# Patient Record
Sex: Male | Born: 1975 | Race: White | Hispanic: No | Marital: Married | Smoking: Never smoker
Health system: Southern US, Community
[De-identification: ages and names within clinical notes are randomized; demographics above are authoritative.]

## PROBLEM LIST (undated history)

## (undated) DIAGNOSIS — M542 Cervicalgia: Secondary | ICD-10-CM

## (undated) DIAGNOSIS — R5600 Simple febrile convulsions: Secondary | ICD-10-CM

## (undated) DIAGNOSIS — IMO0002 Reserved for concepts with insufficient information to code with codable children: Secondary | ICD-10-CM

## (undated) DIAGNOSIS — G8929 Other chronic pain: Secondary | ICD-10-CM

## (undated) HISTORY — DX: Other chronic pain: G89.29

## (undated) HISTORY — DX: Simple febrile convulsions: R56.00

## (undated) HISTORY — DX: Reserved for concepts with insufficient information to code with codable children: IMO0002

## (undated) HISTORY — DX: Cervicalgia: M54.2

---

## 2005-10-26 ENCOUNTER — Ambulatory Visit: Payer: Self-pay | Admitting: Internal Medicine

## 2006-01-10 ENCOUNTER — Ambulatory Visit: Payer: Self-pay | Admitting: Family Medicine

## 2007-03-08 DIAGNOSIS — R569 Unspecified convulsions: Secondary | ICD-10-CM | POA: Insufficient documentation

## 2007-07-08 ENCOUNTER — Encounter: Payer: Self-pay | Admitting: Family Medicine

## 2007-08-28 ENCOUNTER — Telehealth: Payer: Self-pay | Admitting: *Deleted

## 2007-08-31 ENCOUNTER — Telehealth: Payer: Self-pay | Admitting: Family Medicine

## 2007-09-10 ENCOUNTER — Ambulatory Visit (HOSPITAL_COMMUNITY): Admission: RE | Admit: 2007-09-10 | Discharge: 2007-09-11 | Payer: Self-pay | Admitting: Orthopedic Surgery

## 2007-10-04 ENCOUNTER — Ambulatory Visit: Payer: Self-pay | Admitting: Family Medicine

## 2007-10-04 DIAGNOSIS — L509 Urticaria, unspecified: Secondary | ICD-10-CM

## 2010-05-14 ENCOUNTER — Ambulatory Visit: Payer: Self-pay | Admitting: Family Medicine

## 2010-05-14 DIAGNOSIS — H8110 Benign paroxysmal vertigo, unspecified ear: Secondary | ICD-10-CM

## 2010-05-25 ENCOUNTER — Ambulatory Visit: Payer: Self-pay | Admitting: Internal Medicine

## 2010-05-25 DIAGNOSIS — B9789 Other viral agents as the cause of diseases classified elsewhere: Secondary | ICD-10-CM

## 2010-05-26 ENCOUNTER — Telehealth: Payer: Self-pay | Admitting: Internal Medicine

## 2010-08-25 NOTE — Assessment & Plan Note (Signed)
Summary: BRONCHITIS ? // RS   Vital Signs:  Patient profile:   35 year old male Height:      71 inches Weight:      209 pounds BMI:     29.25 O2 Sat:      99 % on Room air Temp:     98.5 degrees F oral Pulse rate:   71 / minute BP sitting:   100 / 70  (left arm) Cuff size:   large  Vitals Entered By: Romualdo Bolk, CMA (AAMA) (May 25, 2010 3:17 PM)  O2 Flow:  Room air CC: Coughing, congestion, sore throat, weak, achey and has a headache. Pt states that this has been going on since 10/27 and worse the past few days.    History of Present Illness: Jesse Reese comes in today  for    above today acute problem.   he is generally well and was seen this past month for positional vertigo that has subsided  and now has had 4-5 days of  cough congestion and st   and the past 24 hours severe myalgias and malasie .. cough is dry to some mucous . Marland Kitchen Took nyquil last pm.  Works as Environmental manager and busy.  No asthma .    ?if had a fever .  some chills  last night .   usee  advil cold.  No hx of pneumonia  and no cp sob . or wheezing.   Has children at home . some RTIs .    Preventive Screening-Counseling & Management  Alcohol-Tobacco     Smoking Status: quit  Caffeine-Diet-Exercise     Does Patient Exercise: yes  Current Medications (verified): 1)  Meclizine Hcl 25 Mg Tabs (Meclizine Hcl) .Marland Kitchen.. 1 Q 4 Hours As Needed Dizziness  Allergies (verified): No Known Drug Allergies  Past History:  Past medical, surgical, family and social histories (including risk factors) reviewed for relevance to current acute and chronic problems.  Past Medical History: Reviewed history from 05/14/2010 and no changes required. 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 Denies surgical history  Past History:  Care Management: None Current  Family History: Reviewed history from  03/08/2007 and no changes required. Family History Hypertension Family History Psychiatric care  Social History: Reviewed history from 03/08/2007 and no changes required. Occupation:    Product/process development scientist   Former Smoker Alcohol use-yes Drug use-no Regular exercise-yes Married  Review of Systems  The patient denies anorexia, weight loss, vision loss, decreased hearing, hoarseness, chest pain, syncope, dyspnea on exertion, peripheral edema, abdominal pain, melena, hematochezia, and severe indigestion/heartburn.         myalgias   severe today   Physical Exam  General:  alert, well-developed, well-nourished, and well-hydrated.  looks washed out  Head:  normocephalic and atraumatic.   Eyes:  clear  without discharge  Ears:  R ear normal and L ear normal.   Nose:  no external deformity, no external erythema, and no nasal discharge.  congested  no face pain Mouth:  good dentition and pharynx pink and moist.   mild erythema Neck:  No deformities, masses, or tenderness noted. supple Lungs:  Normal respiratory effort, chest expands symmetrically. Lungs are clear to auscultation, no crackles or wheezes.no dullness.   Heart:  Normal rate and regular rhythm. S1 and S2 normal without gallop, murmur, click, rub or other extra sounds.no lifts.   Abdomen:  Bowel sounds positive,abdomen soft and non-tender without masses, organomegaly or  noted. Pulses:  nl cap refill  Skin:  turgor normal, color normal, no ecchymoses, and no petechiae.   Cervical Nodes:  No lymphadenopathy noted Psych:  Oriented X3, good eye contact, not anxious appearing, and not depressed appearing.     Impression & Recommendations:  Problem # 1:  VIRAL INFECTION, ACUTE (ICD-079.99)  acts flu like but days into illness    exam nl except for ur congestion .     no signs of pneumonia  reviewed alarm symptoms rest fluids  comfort medications  .   reevaluate if   needed  and as discussed .   His updated medication list for  this problem includes:    Hydrocodone-homatropine 5-1.5 Mg/74ml Syrp (Hydrocodone-homatropine) .Marland Kitchen... 1-2 tsp by mouth q4-6 hours as needed cough  Discussed symptomatic relief.   Complete Medication List: 1)  Meclizine Hcl 25 Mg Tabs (Meclizine hcl) .Marland Kitchen.. 1 q 4 hours as needed dizziness 2)  Hydrocodone-homatropine 5-1.5 Mg/30ml Syrp (Hydrocodone-homatropine) .Marland Kitchen.. 1-2 tsp by mouth q4-6 hours as needed cough  Patient Instructions: 1)  This acts like a viral infection  but  need to   be on alert for  early bacterial infection such as persistent fever  shortness or breath. 2)  rest tonight   3)  take temerperature to document and call tomorrow   if having fever  4)  fever is over 100- 100.3  5)  Increase fluids to avoid  dehydration 6)  Advil tylenol ok for body aches if needed 7)  Cough med if needed for comfort. Prescriptions: HYDROCODONE-HOMATROPINE 5-1.5 MG/5ML SYRP (HYDROCODONE-HOMATROPINE) 1-2 tsp by mouth q4-6 hours as needed cough  #6  oz x 1   Entered and Authorized by:   Madelin Headings MD   Signed by:   Madelin Headings MD on 05/25/2010   Method used:   Print then Give to Patient   RxID:   [id number]    Orders Added: 1)  Est. Patient Level III [62130]

## 2010-08-25 NOTE — Progress Notes (Signed)
Summary: Vomiting, ha and achey all over  Phone Note Call from Patient Call back at Home Phone [phone] Call back at [phone]   Caller: wife Summary of Call: Wife called saying that pt is now vomiting, sore all over and has a ha. Wife states that he has been vomiting twice. He has been able to eat something then falls asleep before he has a chance to vomit. He can't lift his head off the pillow. Keeps fluids down. He is having dizziness. Ha's feel like a hangover ha's. No fever. Initial call taken by: Romualdo Bolk, CMA (AAMA),  May 26, 2010 4:01 PM  Follow-up for Phone Call        Per [name]- Does his throat hurt more like strep? Can call in phenergan 25mg  #12 1 q 4-6 hours as needed nausea and vomiting. Increase hydration and ov tomorrow.  Pt's wife aware of this, rx called in and ov scheduled. Follow-up by: Romualdo Bolk, CMA (AAMA),  May 26, 2010 4:38 PM    New/Updated Medications: PROMETHAZINE HCL 25 MG TABS (PROMETHAZINE HCL) 1 by mouth q 4-6 hours as needed for nausea and vomiting Prescriptions: PROMETHAZINE HCL 25 MG TABS (PROMETHAZINE HCL) 1 by mouth q 4-6 hours as needed for nausea and vomiting  #12 x 0   Entered by:   Romualdo Bolk, CMA (AAMA)   Authorized by:   Madelin Headings MD   Signed by:   Romualdo Bolk, CMA (AAMA) on 05/26/2010   Method used:   Electronically to        [facility]. [address] [phone]* (retail)       [address]       [place], [place]  [address]       Ph: [phone] or [phone]       Fax: [phone]   RxID:   [id number]

## 2010-08-25 NOTE — Assessment & Plan Note (Signed)
Summary: dizzy spells/njr   Vital Signs:  Patient profile:   35 year old male Weight:      209 pounds O2 Sat:      98 % Temp:     97.7 degrees F Pulse rate:   61 / minute BP sitting:   108 / 86  (left arm) Cuff size:   large  Vitals Entered By: Pura Spice, RN (May 14, 2010 10:28 AM)  Contraindications/Deferment of Procedures/Staging:    Test/Procedure: FLU VAX    Reason for deferment: patient declined  CC: dizzy spells at nite time. last 2-3 min. denies losing conscousness. Hx diabetes in famkily    History of Present Illness: Here for 6 brief episodes of dizziness which he describes as the room spinning around him. 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 he had mild nausea with several of them but no vomitting. No HA or vision changes or any other neurologic deficilts. No recent head colds or allergy problems.   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 Denies surgical history  Review of Systems  The patient denies anorexia, fever, weight loss, weight gain, vision loss, decreased hearing, hoarseness, chest pain, syncope, dyspnea on exertion, peripheral edema, prolonged cough, headaches, hemoptysis, abdominal pain, melena, hematochezia, severe indigestion/heartburn, hematuria, incontinence, genital sores, muscle weakness, suspicious skin lesions, transient blindness, difficulty walking, depression, unusual weight change, abnormal bleeding, enlarged lymph nodes, angioedema, breast masses, and testicular masses.    Physical Exam  General:  Well-developed,well-nourished,in no acute distress; alert,appropriate and cooperative throughout examination Head:  Normocephalic and atraumatic without obvious abnormalities. No apparent alopecia or  balding. Eyes:  No corneal or conjunctival inflammation noted. EOMI. Perrla. Funduscopic exam benign, without hemorrhages, exudates or papilledema. Vision grossly normal. Ears:  External ear exam shows no significant lesions or deformities.  Otoscopic examination reveals clear canals, tympanic membranes are intact bilaterally without bulging, retraction, inflammation or discharge. Hearing is grossly normal bilaterally. Nose:  External nasal examination shows no deformity or inflammation. Nasal mucosa are pink and moist without lesions or exudates. Mouth:  Oral mucosa and oropharynx without lesions or exudates.  Teeth in good repair. Neck:  No deformities, masses, or tenderness noted. Lungs:  Normal respiratory effort, chest expands symmetrically. Lungs are clear to auscultation, no crackles or wheezes. Heart:  Normal rate and regular rhythm. S1 and S2 normal without gallop, murmur, click, rub or other extra sounds. Neurologic:  No cranial nerve deficits noted. Station and gait are normal. Plantar reflexes are down-going bilaterally. DTRs are symmetrical throughout. Sensory, motor and coordinative functions appear intact.   Impression & Recommendations:  Problem # 1:  BENIGN POSITIONAL VERTIGO (ICD-386.11)  His updated medication list for this problem includes:    Meclizine Hcl 25 Mg Tabs (Meclizine hcl) .Marland Kitchen... 1 q 4 hours as needed dizziness  Complete Medication List: 1)  Meclizine Hcl 25 Mg Tabs (Meclizine hcl) .Marland Kitchen.. 1 q 4 hours as needed dizziness  Patient Instructions: 1)  Try taking daily Claritin D for a month or so. Drink plenty of water.  2)  Please schedule a follow-up appointment as needed .  Prescriptions: MECLIZINE HCL 25 MG TABS (MECLIZINE HCL) 1 q 4 hours as needed dizziness  #60 x 5   Entered and Authorized by:   Nelwyn Salisbury MD  Signed by:   Nelwyn Salisbury MD on 05/14/2010   Method used:   Electronically to        [facility]. [address]. [phone]* (retail)       [address]       [place], [place]  [address]       Ph: [phone] or [phone]       Fax: [phone]   RxID:   [id number]    Orders Added: 1)  Est. Patient Level IV [25427]

## 2010-12-31 ENCOUNTER — Other Ambulatory Visit: Payer: Self-pay | Admitting: Family Medicine

## 2010-12-31 DIAGNOSIS — M542 Cervicalgia: Secondary | ICD-10-CM

## 2011-01-22 ENCOUNTER — Other Ambulatory Visit: Payer: Self-pay

## 2011-02-11 MED ORDER — DIAZEPAM 2 MG PO TABS
10.0000 mg | ORAL_TABLET | Freq: Once | ORAL | Status: AC
  Administered 2011-02-12: 10 mg via ORAL

## 2011-02-12 ENCOUNTER — Ambulatory Visit
Admission: RE | Admit: 2011-02-12 | Discharge: 2011-02-12 | Disposition: A | Discharge status: Home / Self Care | Payer: 59 | Source: Ambulatory Visit | Attending: Family Medicine | Admitting: Family Medicine

## 2011-02-12 DIAGNOSIS — M542 Cervicalgia: Secondary | ICD-10-CM

## 2011-02-12 MED ORDER — MEPERIDINE HCL 100 MG/ML IJ SOLN
75.0000 mg | Freq: Once | INTRAMUSCULAR | Status: AC
  Administered 2011-02-12: 75 mg via INTRAMUSCULAR

## 2011-02-12 MED ORDER — IOHEXOL 300 MG/ML  SOLN
10.0000 mL | Freq: Once | INTRAMUSCULAR | Status: AC | PRN
  Administered 2011-02-12: 10 mL via INTRATHECAL

## 2011-02-12 MED ORDER — ONDANSETRON HCL 4 MG/2ML IJ SOLN
4.0000 mg | Freq: Once | INTRAMUSCULAR | Status: AC
  Administered 2011-02-12: 4 mg via INTRAMUSCULAR

## 2011-02-24 ENCOUNTER — Telehealth: Payer: Self-pay | Admitting: *Deleted

## 2011-02-24 MED ORDER — PREDNISONE (PAK) 10 MG PO TABS: 10.0000 mg | ORAL_TABLET | Freq: Every day | ORAL | Status: AC

## 2011-02-24 NOTE — Telephone Encounter (Signed)
Call in a 12 day taper pack of 10 mg prednisone 

## 2011-02-24 NOTE — Telephone Encounter (Signed)
Script called in and pt aware. 

## 2011-02-24 NOTE — Telephone Encounter (Signed)
Requesting rx for prednisone, pt has poison ivy.  Please send to [facility] ([address]

## 2011-04-07 ENCOUNTER — Encounter: Payer: Self-pay | Admitting: Family Medicine

## 2011-04-07 ENCOUNTER — Ambulatory Visit (INDEPENDENT_AMBULATORY_CARE_PROVIDER_SITE_OTHER): Payer: 59 | Admitting: Family Medicine

## 2011-04-07 VITALS — BP 120/80 | HR 73 | Temp 97.7°F | Wt 210.0 lb

## 2011-04-07 DIAGNOSIS — J329 Chronic sinusitis, unspecified: Secondary | ICD-10-CM

## 2011-04-07 MED ORDER — AZITHROMYCIN 250 MG PO TABS: ORAL_TABLET | ORAL | Status: AC

## 2011-04-07 MED ORDER — HYDROCODONE-HOMATROPINE 5-1.5 MG/5ML PO SYRP: 5.0000 mL | ORAL_SOLUTION | Freq: Four times a day (QID) | ORAL | Status: DC | PRN

## 2011-04-07 NOTE — Progress Notes (Signed)
  Subjective:    Patient ID: Jesse Reese, male    DOB: 1976-06-27, 35 y.o.   MRN: [id number]  HPI Here for 5 days of sinus pressure, PND, ST, and a dry cough.    Review of Systems  Constitutional: Negative.   HENT: Positive for congestion, sore throat, postnasal drip and sinus pressure.   Eyes: Negative.   Respiratory: Positive for cough.        Objective:   Physical Exam  Constitutional: He appears well-developed and well-nourished.  HENT:  Right Ear: External ear normal.  Left Ear: External ear normal.  Nose: Nose normal.  Mouth/Throat: Oropharynx is clear and moist. No oropharyngeal exudate.  Eyes: Conjunctivae are normal. Pupils are equal, round, and reactive to light.  Neck: Neck supple. No thyromegaly present.  Pulmonary/Chest: Effort normal and breath sounds normal.  Lymphadenopathy:    He has no cervical adenopathy.          Assessment & Plan:  Rest, fluids

## 2011-04-20 ENCOUNTER — Other Ambulatory Visit: Payer: Self-pay | Admitting: Family Medicine

## 2011-04-20 NOTE — Telephone Encounter (Signed)
Call in another 240 ml bottle  

## 2011-04-20 NOTE — Telephone Encounter (Signed)
Pt requesting refill on HYDROcodone-homatropine (HYCODAN) 5-1.5 MG/5ML syrup   [facility] [address]

## 2011-04-21 MED ORDER — HYDROCODONE-HOMATROPINE 5-1.5 MG/5ML PO SYRP: 5.0000 mL | ORAL_SOLUTION | Freq: Four times a day (QID) | ORAL | Status: DC | PRN

## 2011-04-21 NOTE — Telephone Encounter (Signed)
Pt called to check on status of Hydrocodone-Homatropine (Hycodan) Syrup. Pt has to pick up script today. Pt is leaving to go out of town this afternoon and will not be back until Tues. [facility] on [address] and [address].

## 2011-04-21 NOTE — Telephone Encounter (Signed)
I called in script and left voice message for pt. 

## 2012-05-15 ENCOUNTER — Ambulatory Visit: Payer: 59 | Admitting: Family Medicine

## 2012-05-17 ENCOUNTER — Ambulatory Visit (INDEPENDENT_AMBULATORY_CARE_PROVIDER_SITE_OTHER): Payer: 59 | Admitting: Family Medicine

## 2012-05-17 ENCOUNTER — Encounter: Payer: Self-pay | Admitting: Family Medicine

## 2012-05-17 VITALS — BP 120/80 | HR 90 | Temp 98.2°F | Wt 222.0 lb

## 2012-05-17 DIAGNOSIS — F411 Generalized anxiety disorder: Secondary | ICD-10-CM

## 2012-05-17 DIAGNOSIS — F419 Anxiety disorder, unspecified: Secondary | ICD-10-CM

## 2012-05-17 MED ORDER — FLUOXETINE HCL 20 MG PO TABS: 20.0000 mg | ORAL_TABLET | Freq: Every day | ORAL | Status: DC

## 2012-05-17 MED ORDER — LORAZEPAM 1 MG PO TABS: 1.0000 mg | ORAL_TABLET | Freq: Three times a day (TID) | ORAL | Status: DC | PRN

## 2012-05-17 NOTE — Progress Notes (Signed)
  Subjective:    Patient ID: Jesse Reese, male    DOB: 1976/02/07, 36 y.o.   MRN: [id number]  HPI Here to discuss possible "panic attacks". He has had anxiety episodes for years but over the past year these have become more frequent and more intense. He is under constant stress, primarily from his job. He works long hours at his job and then works more on his computer at home every day. He can't relax, he has trouble sleeping. At times he feels his chest get tight and he feels SOB. No chest pains. These are not related to exertion.    Review of Systems  Constitutional: Negative.   Respiratory: Positive for chest tightness and shortness of breath. Negative for cough and wheezing.   Cardiovascular: Negative.   Neurological: Negative.   Psychiatric/Behavioral: Positive for disturbed wake/sleep cycle and decreased concentration. Negative for hallucinations, confusion, dysphoric mood and agitation. The patient is nervous/anxious.        Objective:   Physical Exam  Constitutional: He is oriented to person, place, and time. He appears well-developed and well-nourished. No distress.  Neck: No thyromegaly present.  Cardiovascular: Normal rate, regular rhythm, normal heart sounds and intact distal pulses.   Pulmonary/Chest: Effort normal and breath sounds normal.  Lymphadenopathy:    He has no cervical adenopathy.  Neurological: He is alert and oriented to person, place, and time.  Psychiatric: His behavior is normal. Thought content normal.       Anxious           Assessment & Plan:  These are all symptoms of anxiety. Start on Prozac daily and supplement with prn Lorazepam. Recheck in 2-3 weeks

## 2012-05-31 ENCOUNTER — Telehealth: Payer: Self-pay | Admitting: Family Medicine

## 2012-05-31 MED ORDER — MALATHION 0.5 % EX LOTN: TOPICAL_LOTION | Freq: Once | CUTANEOUS | Status: DC

## 2012-05-31 NOTE — Telephone Encounter (Signed)
He and his wife just got back from a trip to [place], and they stayed in a condo while there. She is being treated for scabies with Ovide lotion, and he also has itchy red spots on the arms and torso. We will treat him the same way. I gave the rx to his wife here in the clinic.

## 2012-07-14 ENCOUNTER — Other Ambulatory Visit: Payer: Self-pay | Admitting: Family Medicine

## 2012-07-20 NOTE — Telephone Encounter (Signed)
Call in #[phone] with 5 rf 

## 2012-08-14 ENCOUNTER — Encounter: Payer: Self-pay | Admitting: Family Medicine

## 2012-08-14 ENCOUNTER — Telehealth: Payer: Self-pay | Admitting: Family Medicine

## 2012-08-14 ENCOUNTER — Ambulatory Visit (INDEPENDENT_AMBULATORY_CARE_PROVIDER_SITE_OTHER): Payer: 59 | Admitting: Family Medicine

## 2012-08-14 VITALS — HR 104 | Temp 99.3°F | Wt 224.0 lb

## 2012-08-14 DIAGNOSIS — J111 Influenza due to unidentified influenza virus with other respiratory manifestations: Secondary | ICD-10-CM

## 2012-08-14 MED ORDER — HYDROCODONE-HOMATROPINE 5-1.5 MG/5ML PO SYRP: 5.0000 mL | ORAL_SOLUTION | Freq: Three times a day (TID) | ORAL | Status: DC | PRN

## 2012-08-14 NOTE — Patient Instructions (Addendum)
Influenza or Influenza-like illness Facts Flu (influenza) is a contagious respiratory illness caused by the influenza viruses. It can cause mild to severe illness. While most healthy people recover from the flu without specific treatment and without complications, older people, young children, and people with certain health conditions are at higher risk for serious complications from the flu, including death. CAUSES   The flu virus is spread from person to person by respiratory droplets from coughing and sneezing.  A person can also become infected by touching an object or surface with a virus on it and then touching their mouth, eye or nose.  Adults may be able to infect others from 1 day before symptoms occur and up to 7 days after getting sick. So it is possible to give someone the flu even before you know you are sick and continue to infect others while you are sick. SYMPTOMS   Fever (usually high).  Headache.  Tiredness (can be extreme).  Cough.  Sore throat.  Runny or stuffy nose.  Body aches.  Diarrhea and vomiting may also occur, particularly in children.  These symptoms are referred to as "flu-like symptoms". A lot of different illnesses, including the common cold, can have similar symptoms. DIAGNOSIS   There are tests that can determine if you have the flu as long you are tested within the first 2 or 3 days of illness.  A doctor's exam and additional tests may be needed to identify if you have a disease that is a complicating the flu. RISKS AND COMPLICATIONS  Some of the complications caused by the flu include:  Bacterial pneumonia or progressive pneumonia caused by the flu virus.  Loss of body fluids (dehydration).  Worsening of chronic medical conditions, such as heart failure, asthma, or diabetes.  Sinus problems and ear infections. HOME CARE INSTRUCTIONS   Seek medical care early on.  If you are at high risk from complications of the flu, consult your  health-care provider as soon as you develop flu-like symptoms. Those at high risk for complications include:  People 65 years or older.  People with chronic medical conditions, including diabetes.  Pregnant women.  Young children.  Your caregiver may recommend use of an antiviral medication to help treat the flu.  If you get the flu, get plenty of rest, drink a lot of liquids, and avoid using alcohol and tobacco.  You can take over-the-counter medications to relieve the symptoms of the flu if your caregiver approves. (Never give aspirin to children or teenagers who have flu-like symptoms, particularly fever). PREVENTION  The single best way to prevent the flu is to get a flu vaccine each fall. Other measures that can help protect against the flu are:  Antiviral Medications  A number of antiviral drugs are approved for use in preventing the flu. These are prescription medications, and a doctor should be consulted before they are used.  Habits for Good Health  Cover your nose and mouth with a tissue when you cough or sneeze, throw the tissue away after you use it.  Wash your hands often with soap and water, especially after you cough or sneeze. If you are not near water, use an alcohol-based hand cleaner.  Avoid people who are sick.  If you get the flu, stay home from work or school. Avoid contact with other people so that you do not make them sick, too.  Try not to touch your eyes, nose, or mouth as germs ore often spread this way. IN CHILDREN,  EMERGENCY WARNING SIGNS THAT NEED URGENT MEDICAL ATTENTION:  Fast breathing or trouble breathing.  Bluish skin color.  Not drinking enough fluids.  Not waking up or not interacting.  Being so irritable that the child does not want to be held.  Flu-like symptoms improve but then return with fever and worse cough.  Fever with a rash. IN ADULTS, EMERGENCY WARNING SIGNS THAT NEED URGENT MEDICAL ATTENTION:  Difficulty breathing or  shortness of breath.  Pain or pressure in the chest or abdomen.  Sudden dizziness.  Confusion.  Severe or persistent vomiting. SEEK IMMEDIATE MEDICAL CARE IF:  You or someone you know is experiencing any of the symptoms above. When you arrive at the emergency center,report that you think you have the flu. You may be asked to wear a mask and/or sit in a secluded area to protect others from getting sick. MAKE SURE YOU:   Understand these instructions.  Monitor your condition.  Seek medical care if you are getting worse, or not improving. Document Released: 07/15/2003 Document Revised: 10/04/2011 Document Reviewed: 04/10/2009 [facility] Patient Information 2013 [place], [place].

## 2012-08-14 NOTE — Telephone Encounter (Signed)
Clent [facility] schedule full scheduled with [facility]

## 2012-08-14 NOTE — Progress Notes (Signed)
Chief Complaint  Patient presents with  . Cough    fever, body aches, chills, fatigue, nausea, headache     HPI:  -started: yesterday -symptoms:nasal congestion, sore throat, cough, fever up to 101.6, body aches -denies:fever, SOB, NVD, tooth pain -has tried: hydromet -sick contacts: daughter was sick last week with similar symptoms, also many people at work with similar symptoms -Hx of: no hx of chronic lung disease or chronic disease -did not have flu shot  ROS: See pertinent positives and negatives per HPI.  Past Medical History  Diagnosis Date  . Febrile seizure   . Chickenpox   . 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 Chronic neck pain     sees Dr. Nickola Major     Family History  Problem Relation Age of Onset  . Hypertension Other   . Mental illness Other     History   Social History  . Marital Status: Married    Spouse Name: N/A    Number of Children: N/A  . Years of Education: N/A   Social History Main Topics  . Smoking status: Never Smoker   . Smokeless tobacco: Never Used  . Alcohol Use: No  . Drug Use: No  . Sexually Active: None   Other Topics Concern  . None   Social History Narrative  . None    Current outpatient prescriptions:FLUoxetine (PROZAC) 20 MG tablet, Take 1 tablet (20 mg total) by mouth daily., Disp: 30 tablet, Rfl: 2;  LORazepam (ATIVAN) 1 MG tablet, TAKE 1 TABLET BY MOUTH EVERY 8 HOURS AS NEEDED FOR ANXIETY, Disp: 60 tablet, Rfl: 5 malathion (OVIDE) 0.5 % lotion, Apply topically once. Sprinkle lotion on dry hair and rub gently until the scalp is thoroughly moistened. Allow to dry naturally and leave uncovered., Disp: 59 mL, Rfl: 2;  tapentadol (NUCYNTA) 50 MG TABS, Take 50 mg by mouth 2 (two) times daily as needed., Disp: , Rfl: ;  HYDROcodone-homatropine (HYCODAN) 5-1.5 MG/5ML syrup, Take 5 mLs by mouth every 8 (eight) hours as needed for cough., Disp: 120 mL, Rfl: 0  EXAM:  Filed Vitals:   08/14/12 1349  Pulse: 104  Temp: 99.3 F  (37.4 C)    There is no height on file to calculate BMI.  GENERAL: vitals reviewed and listed above, alert, oriented, appears well hydrated and in no acute distress  HEENT: atraumatic, conjunttiva clear, no obvious abnormalities on inspection of external nose and ears, normal appearance of ear canals and TMs, clear nasal congestion, mild post oropharyngeal erythema with PND, no tonsillar edema or exudate, no sinus TTP  NECK: no obvious masses on inspection  LUNGS: clear to auscultation bilaterally, no wheezes, rales or rhonchi, good air movement  CV: HRRR, no peripheral edema  MS: moves all extremities without noticeable abnormality  PSYCH: pleasant and cooperative, no obvious depression or anxiety  ASSESSMENT AND PLAN:  Discussed the following assessment and plan:  1. Influenza-like illness  HYDROcodone-homatropine (HYCODAN) 5-1.5 MG/5ML syrup   -not high risks, not severe illness - advised supportive care and return precuations, cough med provided (risks discussed) -Patient advised to return or notify a doctor immediately if symptoms worsen or persist or new concerns arise.  Patient Instructions  Influenza or Influenza-like illness Facts Flu (influenza) is a contagious respiratory illness caused by the influenza viruses. It can cause mild to severe illness. While most healthy people recover from the flu without specific treatment and without complications, older people, young children, and people with certain  health conditions are at higher risk for serious complications from the flu, including death. CAUSES   The flu virus is spread from person to person by respiratory droplets from coughing and sneezing.  A person can also become infected by touching an object or surface with a virus on it and then touching their mouth, eye or nose.  Adults may be able to infect others from 1 day before symptoms occur and up to 7 days after getting sick. So it is possible to give someone the  flu even before you know you are sick and continue to infect others while you are sick. SYMPTOMS   Fever (usually high).  Headache.  Tiredness (can be extreme).  Cough.  Sore throat.  Runny or stuffy nose.  Body aches.  Diarrhea and vomiting may also occur, particularly in children.  These symptoms are referred to as "flu-like symptoms". A lot of different illnesses, including the common cold, can have similar symptoms. DIAGNOSIS   There are tests that can determine if you have the flu as long you are tested within the first 2 or 3 days of illness.  A doctor's exam and additional tests may be needed to identify if you have a disease that is a complicating the flu. RISKS AND COMPLICATIONS  Some of the complications caused by the flu include:  Bacterial pneumonia or progressive pneumonia caused by the flu virus.  Loss of body fluids (dehydration).  Worsening of chronic medical conditions, such as heart failure, asthma, or diabetes.  Sinus problems and ear infections. HOME CARE INSTRUCTIONS   Seek medical care early on.  If you are at high risk from complications of the flu, consult your health-care provider as soon as you develop flu-like symptoms. Those at high risk for complications include:  People 65 years or older.  People with chronic medical conditions, including diabetes.  Pregnant women.  Young children.  Your caregiver may recommend use of an antiviral medication to help treat the flu.  If you get the flu, get plenty of rest, drink a lot of liquids, and avoid using alcohol and tobacco.  You can take over-the-counter medications to relieve the symptoms of the flu if your caregiver approves. (Never give aspirin to children or teenagers who have flu-like symptoms, particularly fever). PREVENTION  The single best way to prevent the flu is to get a flu vaccine each fall. Other measures that can help protect against the flu are:  Antiviral Medications  A  number of antiviral drugs are approved for use in preventing the flu. These are prescription medications, and a doctor should be consulted before they are used.  Habits for Good Health  Cover your nose and mouth with a tissue when you cough or sneeze, throw the tissue away after you use it.  Wash your hands often with soap and water, especially after you cough or sneeze. If you are not near water, use an alcohol-based hand cleaner.  Avoid people who are sick.  If you get the flu, stay home from work or school. Avoid contact with other people so that you do not make them sick, too.  Try not to touch your eyes, nose, or mouth as germs ore often spread this way. IN CHILDREN, EMERGENCY WARNING SIGNS THAT NEED URGENT MEDICAL ATTENTION:  Fast breathing or trouble breathing.  Bluish skin color.  Not drinking enough fluids.  Not waking up or not interacting.  Being so irritable that the child does not want to be held.  Flu-like  symptoms improve but then return with fever and worse cough.  Fever with a rash. IN ADULTS, EMERGENCY WARNING SIGNS THAT NEED URGENT MEDICAL ATTENTION:  Difficulty breathing or shortness of breath.  Pain or pressure in the chest or abdomen.  Sudden dizziness.  Confusion.  Severe or persistent vomiting. SEEK IMMEDIATE MEDICAL CARE IF:  You or someone you know is experiencing any of the symptoms above. When you arrive at the emergency center,report that you think you have the flu. You may be asked to wear a mask and/or sit in a secluded area to protect others from getting sick. MAKE SURE YOU:   Understand these instructions.  Monitor your condition.  Seek medical care if you are getting worse, or not improving. Document Released: 07/15/2003 Document Revised: 10/04/2011 Document Reviewed: 04/10/2009 [facility] Patient Information 2013 [place], [place], [place]

## 2012-08-14 NOTE — Telephone Encounter (Signed)
Wife called. She and the kids were in to see [name] last week - all dx with influenza & all given TAMIFLU and HYDROMET cough syrup. Now her husband, who is [name] patient, has all the same symptoms. He cannot even get out of bed. She wants to know if [name] can just call him in the [place]-flu & cough syrup. Please advise and call wife back on cell phone - her phone is acting funny, please LMOM if no answer. Pt uses [facility] on [address].

## 2012-08-14 NOTE — Telephone Encounter (Signed)
Pt must be seen,per [name]. Can you call and offer a visit?

## 2012-08-15 ENCOUNTER — Telehealth: Payer: Self-pay

## 2012-08-15 MED ORDER — ONDANSETRON HCL 4 MG PO TABS: ORAL_TABLET | ORAL | Status: DC

## 2012-08-15 NOTE — Telephone Encounter (Signed)
Ok per Dr. Selena Batten to call in Zofran 4 mg take 1 every 6 to 8 hours and pt can try to take ibuprofen for headache instead.  Per Dr. Selena Batten this will probably get better when pt stops vomiting.  Rx called in to pharmacy.    Called and spoke with pt.  Advised of Dr. Elmyra Ricks recommendations.  Advised pt to keep plenty of fluids down.  Advised pt that an appt was needed if he got worse.

## 2012-08-15 NOTE — Telephone Encounter (Signed)
Pt's wife called and states she called earlier.  Pt's nausea and vomiting hit him hard during the night.  Pt needs something for nausea and pt wife states pt has been taking 3 Tylenol  and it is not touching his headache and pt threw the medication up.   Pls advise on what pt needs.  [facility] [address] is the pharmacy.

## 2012-08-17 ENCOUNTER — Telehealth: Payer: Self-pay | Admitting: Family Medicine

## 2012-08-17 NOTE — Telephone Encounter (Signed)
If already got hycodan prescribed at last visit - can not also get this. If pharmacy asking if can substitute then that is ok. But I gave Rx for cough medication at appointment

## 2012-08-21 ENCOUNTER — Telehealth: Payer: Self-pay | Admitting: Family Medicine

## 2012-08-21 NOTE — Telephone Encounter (Signed)
Per Dr. Selena Batten called pt to advise that pt was to only take the cough syrup at night and it is too early to refill. Called and spoke with pt and per Dr.Kim pt needs to be reevaluated if pt is having trouble breathing or having SOB.  If pt does not have a fever pt can return to work.  Per Dr. Selena Batten pt should not have run out of cough medication that quickly.  Advised pt that cough could last for a few weeks and halls with menthol and vicks vapor rub on the chest are good to use along with a humidifier.  Pt states he understands and is aware that rx for hycodan will not be called in again.

## 2012-08-21 NOTE — Telephone Encounter (Signed)
Patient's spouse called stating that he need a refill of his HYDROcodone-homatropine (HYCODAN) 5-1.5 MG/5ML syrup Take 5 mLs by mouth every 8 (eight) hours as needed for cough sent to [facility]. Please assist.

## 2012-08-22 ENCOUNTER — Ambulatory Visit: Payer: 59 | Admitting: Family Medicine

## 2012-08-22 DIAGNOSIS — Z0289 Encounter for other administrative examinations: Secondary | ICD-10-CM

## 2012-09-27 IMAGING — RF DG MYELOGRAM CERVICAL
10 series · 10 of 10 positions shown · IV contrast (omnipaque)
Comparison: MRI 09/10/2007.

MYELOGRAM  INJECTION
TECHNIQUE: Informed consent was obtained from the patient prior to
the procedure, including potential complications of headache,
allergy, infection and pain. Specific instructions were given
regarding 24 hour bedrest post procedure to prevent post-LP
headache.  A timeout procedure was performed.  With the patient
prone, the lower back was prepped with Betadine.  1% Lidocaine was
used for local anesthesia.  Lumbar puncture was performed by the
radiologist at the L2-4Flevel using a 22 gauge needle with return
of clear CSF.  Ninecc of Omnipaque 766was injected into the
subarachnoid space .
CLINICAL DATA: Right arm radicular symptoms.  MRI was
unremarkable.  Evaluate for occult foraminal narrowing.
TECHNIQUE: Multidetector CT imaging of the cervical spine was
performed following myelography.  Multiplanar CT image
reconstructions were also generated.

The individual disc spaces were examined as follows:
C2-3:  Normal.
C3-4:  Normal.
C4-5:  Normal.
C5-6:  Normal.
C6-7:  Normal.
C7-T1:  Mild facet disease.  No stenosis or disc protrusion.

[Series 2: myelogram  white · 1 of 1 slices shown (1 of 10)]
[im 1/1]
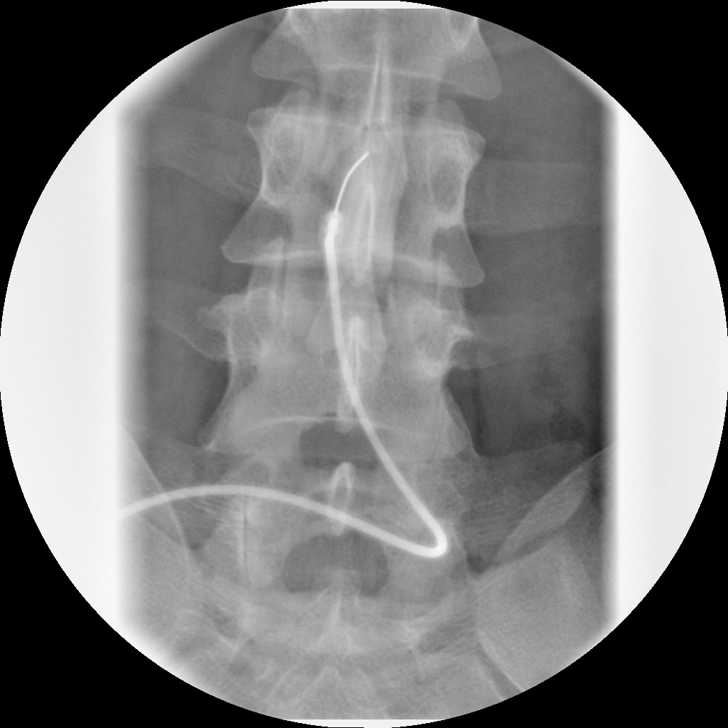

[Series 3: myelogram  white · 1 of 1 slices shown (2 of 10)]
[im 1/1]
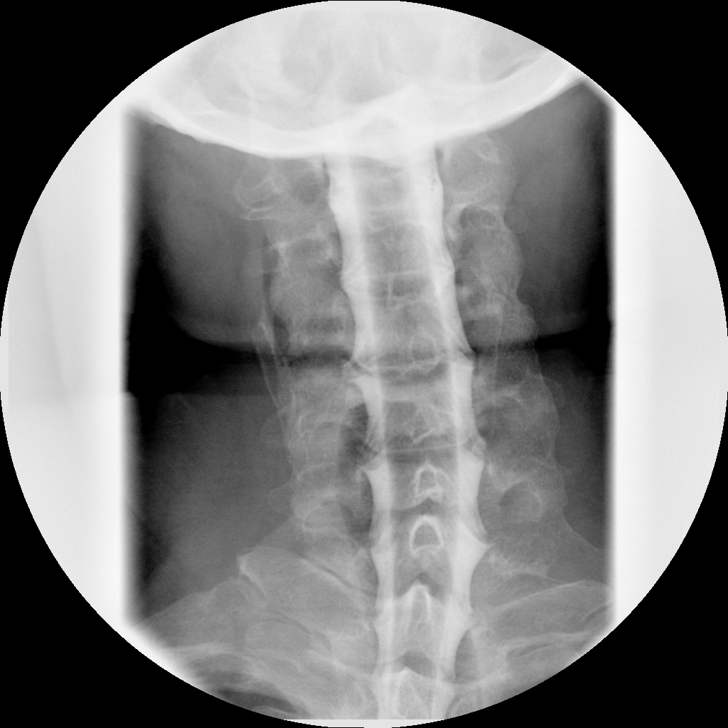

[Series 4: myelogram  white · 1 of 1 slices shown (3 of 10)]
[im 1/1]
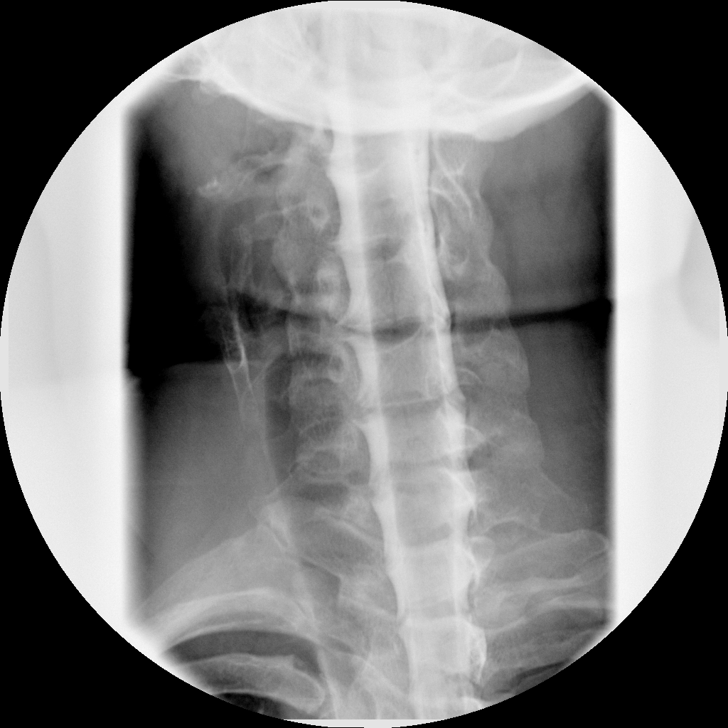

[Series 5: myelogram  white · 1 of 1 slices shown (4 of 10)]
[im 1/1]
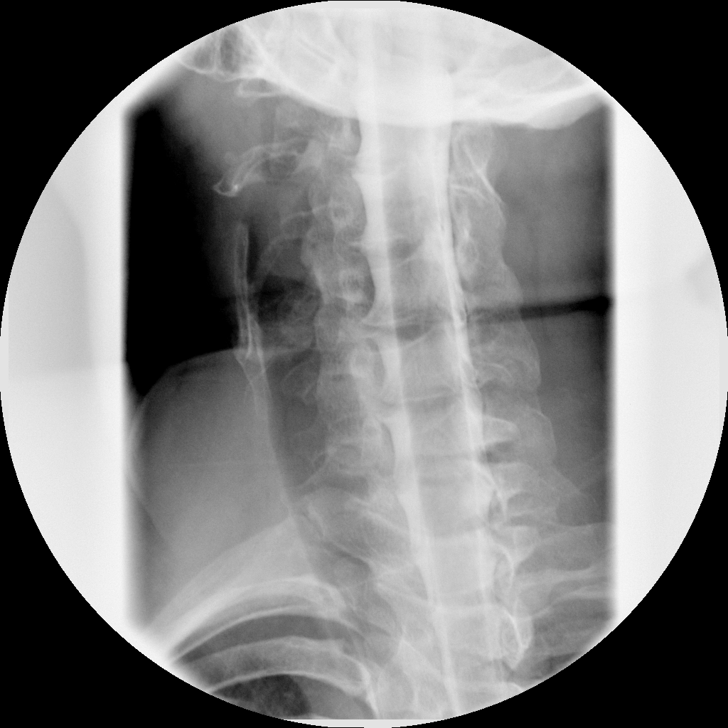

[Series 6: myelogram  white · 1 of 1 slices shown (5 of 10)]
[im 1/1]
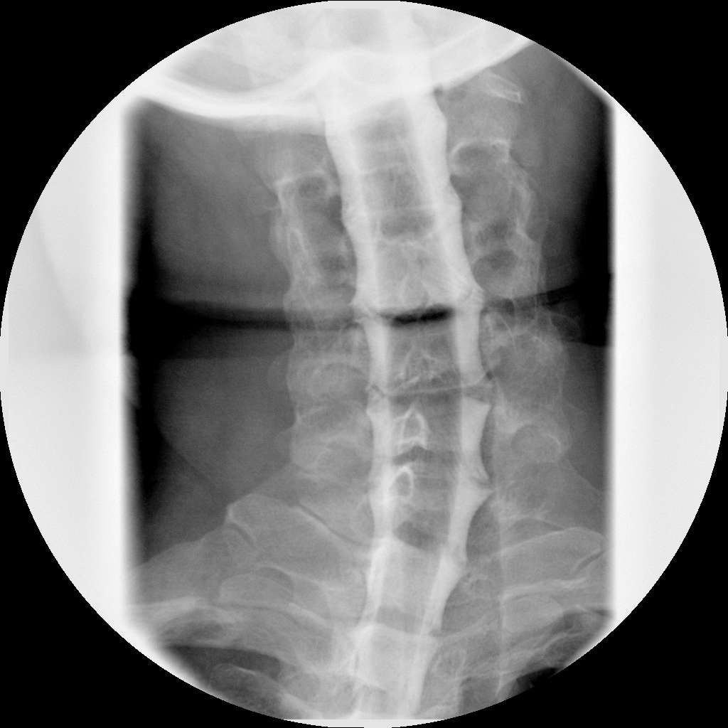

[Series 7: myelogram  white · 1 of 1 slices shown (6 of 10)]
[im 1/1]
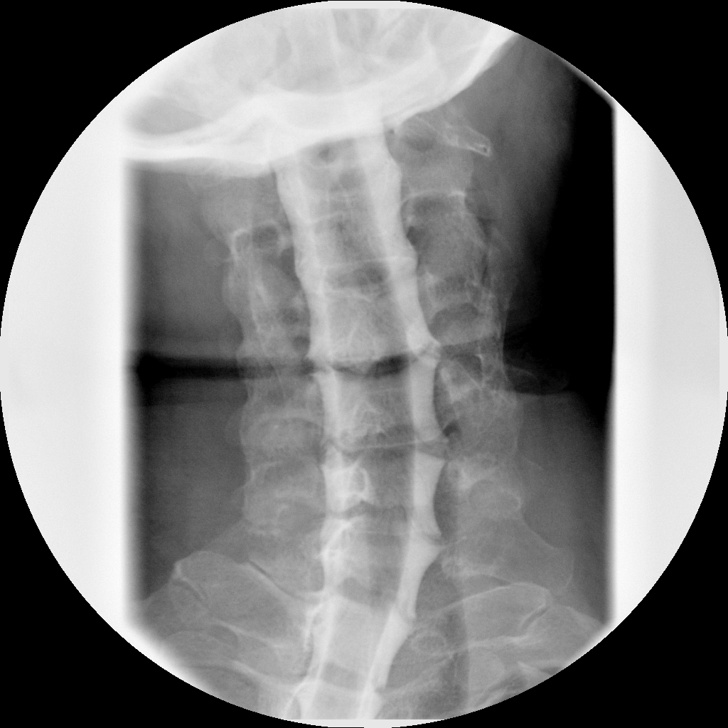

[Series 8: myelogram  white · 1 of 1 slices shown (7 of 10)]
[im 1/1]
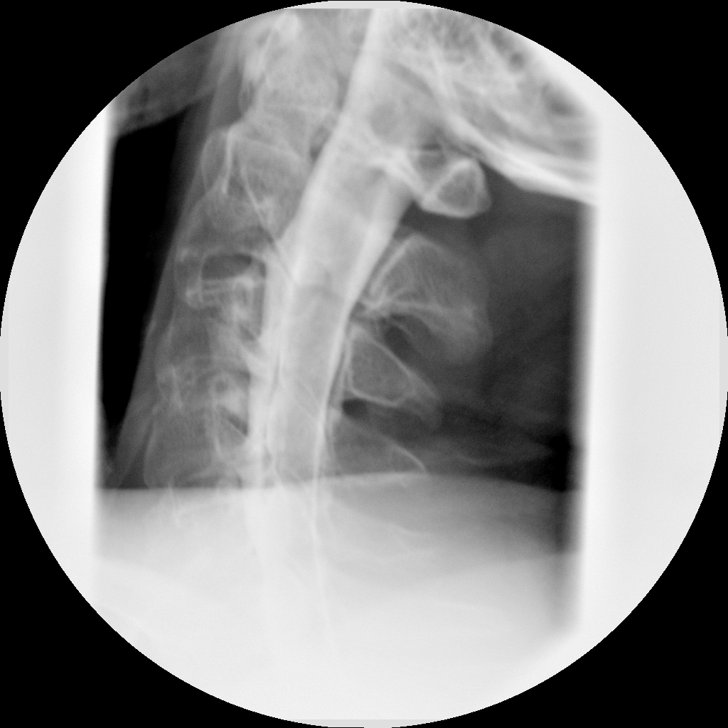

[Series 9: myelogram  white · 1 of 1 slices shown (8 of 10)]
[im 1/1]
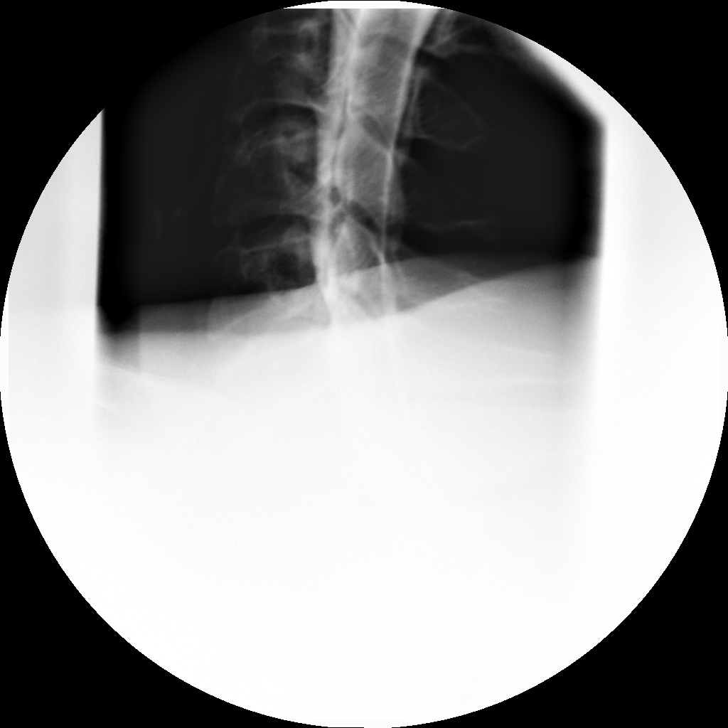

[Series 10: myelogram  white · 1 of 1 slices shown (9 of 10)]
[im 1/1]
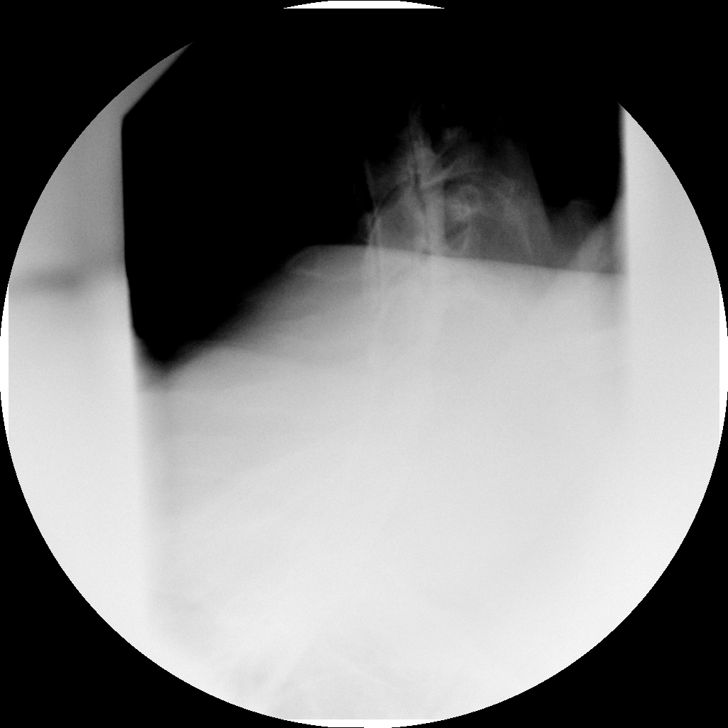

[Series 11: myelogram  white · 1 of 1 slices shown (10 of 10)]
[im 1/1]
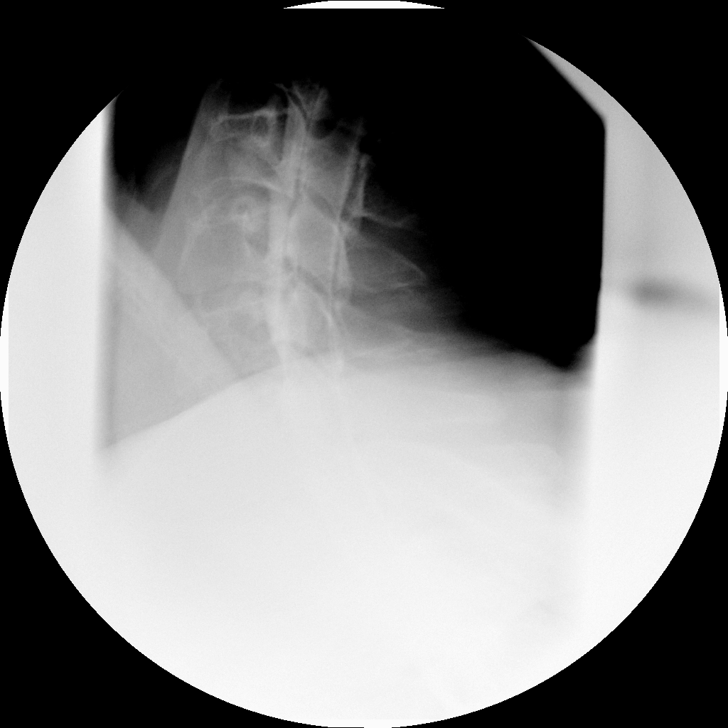

[10 of 10 positions shown; findings below may reference images not displayed]

IMPRESSION: Successful injection of  intrathecal contrast for myelography.

MYELOGRAM CERVICAL
FINDINGS: Good opacification cervical subarachnoid space.  No
lateralizing nerve root cut off or spinal stenosis.  Normal cord
size.  Good preservation of cervical intervertebral disc height.
Anatomic alignment.

Fluoroscopy Time: 1.24 minutes
IMPRESSION: As above.

CT MYELOGRAPHY CERVICAL SPINE
IMPRESSION: Unremarkable cervical myelogram and post myelogram CT.  No
compressive lesion is observed.  No underlying foraminal narrowing
or other lateralizing pathology to explain right arm radiculopathy.

## 2013-12-05 ENCOUNTER — Other Ambulatory Visit: Payer: Self-pay | Admitting: Orthopaedic Surgery

## 2013-12-05 DIAGNOSIS — M79651 Pain in right thigh: Secondary | ICD-10-CM

## 2014-03-14 ENCOUNTER — Encounter: Payer: Self-pay | Admitting: Family Medicine

## 2014-03-14 ENCOUNTER — Telehealth: Payer: Self-pay | Admitting: Family Medicine

## 2014-03-14 ENCOUNTER — Ambulatory Visit (INDEPENDENT_AMBULATORY_CARE_PROVIDER_SITE_OTHER): Payer: 59 | Admitting: Family Medicine

## 2014-03-14 VITALS — BP 134/100 | HR 76 | Temp 98.5°F | Ht 71.0 in | Wt 232.0 lb

## 2014-03-14 DIAGNOSIS — I10 Essential (primary) hypertension: Secondary | ICD-10-CM | POA: Insufficient documentation

## 2014-03-14 LAB — LIPID PANEL
CHOLESTEROL: 160 mg/dL (ref 0–200)
HDL: 34.1 mg/dL — AB (ref >39.00)
NonHDL: 125.9
TRIGLYCERIDES: 222 mg/dL — AB (ref 0.0–149.0)
Total CHOL/HDL Ratio: 5
VLDL: 44.4 mg/dL — ABNORMAL HIGH (ref 0.0–40.0)

## 2014-03-14 LAB — BASIC METABOLIC PANEL
BUN: 11 mg/dL (ref 6–23)
CHLORIDE: 104 meq/L (ref 96–112)
CO2: 28 mEq/L (ref 19–32)
CREATININE: 1.1 mg/dL (ref 0.4–1.5)
Calcium: 9.6 mg/dL (ref 8.4–10.5)
GFR: 78.2 mL/min (ref >60.00)
Glucose, Bld: 84 mg/dL (ref 70–99)
Potassium: 4.1 mEq/L (ref 3.5–5.1)
Sodium: 138 mEq/L (ref 135–145)

## 2014-03-14 LAB — CBC WITH DIFFERENTIAL/PLATELET
BASOS ABS: 0.1 10*3/uL (ref 0.0–0.1)
Basophils Relative: 1.1 % (ref 0.0–3.0)
EOS ABS: 0.1 10*3/uL (ref 0.0–0.7)
Eosinophils Relative: 1.6 % (ref 0.0–5.0)
HCT: 41.2 % (ref 39.0–52.0)
Hemoglobin: 13.8 g/dL (ref 13.0–17.0)
LYMPHS PCT: 30 % (ref 12.0–46.0)
Lymphs Abs: 1.7 10*3/uL (ref 0.7–4.0)
MCHC: 33.6 g/dL (ref 30.0–36.0)
MCV: 78.5 fl (ref 78.0–100.0)
Monocytes Absolute: 0.5 10*3/uL (ref 0.1–1.0)
Monocytes Relative: 9.1 % (ref 3.0–12.0)
NEUTROS ABS: 3.2 10*3/uL (ref 1.4–7.7)
Neutrophils Relative %: 58.2 % (ref 43.0–77.0)
PLATELETS: 226 10*3/uL (ref 150.0–400.0)
RBC: 5.24 Mil/uL (ref 4.22–5.81)
RDW: 12.8 % (ref 11.5–15.5)
WBC: 5.5 10*3/uL (ref 4.0–10.5)

## 2014-03-14 LAB — HEPATIC FUNCTION PANEL
ALT: 56 U/L — ABNORMAL HIGH (ref 0–53)
AST: 32 U/L (ref 0–37)
Albumin: 4.4 g/dL (ref 3.5–5.2)
Alkaline Phosphatase: 47 U/L (ref 39–117)
BILIRUBIN TOTAL: 0.7 mg/dL (ref 0.2–1.2)
Bilirubin, Direct: 0 mg/dL (ref 0.0–0.3)
TOTAL PROTEIN: 7.4 g/dL (ref 6.0–8.3)

## 2014-03-14 LAB — POCT URINALYSIS DIPSTICK
Bilirubin, UA: NEGATIVE
Blood, UA: NEGATIVE
Glucose, UA: NEGATIVE
KETONES UA: NEGATIVE
LEUKOCYTES UA: NEGATIVE
Nitrite, UA: NEGATIVE
PROTEIN UA: NEGATIVE
Spec Grav, UA: <=1.005
Urobilinogen, UA: 0.2
pH, UA: 5.5

## 2014-03-14 LAB — TSH: TSH: 0.57 u[IU]/mL (ref 0.35–4.50)

## 2014-03-14 LAB — LDL CHOLESTEROL, DIRECT: LDL DIRECT: 103.5 mg/dL

## 2014-03-14 MED ORDER — METOPROLOL SUCCINATE ER 50 MG PO TB24: 50.0000 mg | ORAL_TABLET | Freq: Every day | ORAL | Status: DC

## 2014-03-14 NOTE — Progress Notes (Signed)
   Subjective:    Patient ID: Jesse FudgeFrank J Reese, male    DOB: 08/06/1975, 38 y.o.   MRN: [id number]  HPI Here with concerns about his BP. He was told by his pain management clinic that his BP was high and he has been tracking this at home since then. It has consistently been in the 140s or 150s over 90-100. He feels tired and sometimes he feels like his heart is pounding. No chest pain or SOB. He does not use tobacco.    Review of Systems  Respiratory: Negative.   Cardiovascular: Negative.        Objective:   Physical Exam  Constitutional: He appears well-developed and well-nourished.  Cardiovascular: Normal rate, regular rhythm, normal heart sounds and intact distal pulses.   Pulmonary/Chest: Effort normal and breath sounds normal.          Assessment & Plan:  Start on Metoprolol. Get labs. Recheck in one month

## 2014-03-14 NOTE — Telephone Encounter (Signed)
Relevant patient education mailed to patient.  

## 2014-03-14 NOTE — Progress Notes (Signed)
Pre visit review using our clinic review tool, if applicable. No additional management support is needed unless otherwise documented below in the visit note. 

## 2014-04-12 ENCOUNTER — Ambulatory Visit: Payer: 59 | Admitting: Family Medicine

## 2015-10-07 ENCOUNTER — Ambulatory Visit: Payer: Self-pay | Admitting: Family Medicine

## 2015-10-08 ENCOUNTER — Ambulatory Visit: Payer: Self-pay | Admitting: Family Medicine

## 2015-12-24 ENCOUNTER — Telehealth: Payer: Self-pay | Admitting: Family Medicine

## 2015-12-24 NOTE — Telephone Encounter (Signed)
Pt needs a refill on anxiety med. [facility]. Per wife dr fry gave him this med about a year ago. The med is not on med list

## 2015-12-25 NOTE — Telephone Encounter (Signed)
Wife states pt has started having anxiety attacks and would like a refill of the  LORazepam (ATIVAN) 1 MG tablet [[id number]][[id number]]  Prescribed in 2013.  [facility]

## 2015-12-25 NOTE — Telephone Encounter (Signed)
Call in Lorazepam 1 mg to take every 8 hours prn anxiety, #90 with 5 rf

## 2015-12-26 MED ORDER — LORAZEPAM 1 MG PO TABS: 1.0000 mg | ORAL_TABLET | Freq: Three times a day (TID) | ORAL | Status: DC | PRN

## 2015-12-26 NOTE — Telephone Encounter (Signed)
I called in script and spoke with pt. 

## 2016-02-19 ENCOUNTER — Other Ambulatory Visit: Payer: Self-pay

## 2016-02-19 ENCOUNTER — Ambulatory Visit (INDEPENDENT_AMBULATORY_CARE_PROVIDER_SITE_OTHER): Payer: 59 | Admitting: Podiatry

## 2016-02-19 ENCOUNTER — Encounter: Payer: Self-pay | Admitting: Podiatry

## 2016-02-19 VITALS — BP 115/71 | HR 75 | Resp 14

## 2016-02-19 DIAGNOSIS — L089 Local infection of the skin and subcutaneous tissue, unspecified: Secondary | ICD-10-CM

## 2016-02-19 MED ORDER — DOXYCYCLINE HYCLATE 100 MG PO TABS: 100.0000 mg | ORAL_TABLET | Freq: Two times a day (BID) | ORAL | 0 refills | Status: DC

## 2016-02-19 NOTE — Progress Notes (Signed)
   Subjective:    Patient ID: Jesse Reese, male    DOB: 11-19-1975, 40 y.o.   MRN: [id number]  HPI this patient presents to the office with chief complaint of a severely painful blister behind his toes on the bottom of his foot. He says it has become extremely painful since Monday night. He says it appears to be a blister. He wears socks to the office since he has difficulty wearing shoes due to the pain. He gives a history of having a wart under his second and third toes, left foot. He says he applied medication to his self diagnosed wart  that was suggested to him by one of his coworkers after the application. He says the foot has become very painful with blister formation. He presents the office with an antalgic gait for definitive evaluation and treatment    Review of Systems  All other systems reviewed and are negative.      Objective:   Physical Exam GENERAL APPEARANCE: Alert, conversant. Appropriately groomed. No acute distress.  VASCULAR: Pedal pulses are  palpable at  [facility] and PT bilateral.  Capillary refill time is immediate to all digits,  Normal temperature gradient.  Digital hair growth is present bilateral  NEUROLOGIC: sensation is normal to 5.07 monofilament at 5/5 sites bilateral.  Light touch is intact bilateral, Muscle strength normal.  MUSCULOSKELETAL: acceptable muscle strength, tone and stability bilateral.  Intrinsic muscluature intact bilateral.  Rectus appearance of foot and digits noted bilateral.   DERMATOLOGIC: skin color, texture, and turgor are within normal limits except under his second and third toes plantarly left foot.  There is a blister formation with fluctuance noted inside the blister left forefoot. There is redness and swelling extending into the toes and also on the dorsum of the second and third MPJs, left foot.           Assessment & Plan:  Salicylic acid burn with cellulitis/cellulitis left foot  IE.  Incision and Drainage infected blister  left foot.  Culture and sensitivity taken from abscess.  Neosporin/DSD.  Prescribed doxycycline.  Home soaking instructions given.   RTC 1 week.   Helane Gunther DPM

## 2016-02-26 ENCOUNTER — Encounter: Payer: Self-pay | Admitting: Podiatry

## 2016-02-26 ENCOUNTER — Ambulatory Visit (INDEPENDENT_AMBULATORY_CARE_PROVIDER_SITE_OTHER): Payer: 59 | Admitting: Podiatry

## 2016-02-26 DIAGNOSIS — L089 Local infection of the skin and subcutaneous tissue, unspecified: Secondary | ICD-10-CM

## 2016-02-26 NOTE — Progress Notes (Signed)
  This patient presents the office today following a diagnosis of a abscess noted under the sulcus left foot. She was treated with an incision and drainage of the infected blister, left foot. He was treated with doxycycline and given home instructions. He presents the office today saying that his foot is much better. He says he was on vacation this weekend and even played golf on Sunday. He says drainage has stopped from the infected blister site. He presents the office today for continued evaluation and treatment  GENERAL APPEARANCE: Alert, conversant. Appropriately groomed. No acute distress.  VASCULAR: Pedal pulses are  palpable at  [facility] and PT bilateral.  Capillary refill time is immediate to all digits,  Normal temperature gradient.  Digital hair growth is present bilateral  NEUROLOGIC: sensation is normal to 5.07 monofilament at 5/5 sites bilateral.  Light touch is intact bilateral, Muscle strength normal.  MUSCULOSKELETAL: acceptable muscle strength, tone and stability bilateral.  Intrinsic muscluature intact bilateral.  Rectus appearance of foot and digits noted bilateral.   DERMATOLOGIC: skin color, texture, and turgor are within normal limits.  Healing noted under sulcus left foot with no evidence of redness or infection.  No drainage noted. There is persistant skin lesion with thrombi noted in the center of blister.  Peeling dead skin noted  at blister site.  S/p abscess/cellulitis/ infected blister.  ROV  examination of the infected site reveals normal painful skin noted in developing at the site of the infection. No redness, swelling or pain noted except for the center area, which appears to be a wart. Patient is continue his antibiotics until completed and to make an appointment for treatment of the wart return to clinic when necessary   Helane Gunther DPM

## 2016-03-02 NOTE — Progress Notes (Signed)
Specimen was collected and sent to the lab, but there was an error with labeling and the specimen was discarded.

## 2016-03-09 ENCOUNTER — Telehealth: Payer: Self-pay | Admitting: Family Medicine

## 2016-03-09 NOTE — Telephone Encounter (Signed)
Pt was trying to get the oxycodone early and the pharmacist would like to have a call back.  I let the pharmacist know that Dr. Clent RidgesFry is not in the office and will not be back until Thursday they stated that was okay they just wanted to let Dr. Clent RidgesFry know that pt was trying to get Rx early.

## 2016-03-09 NOTE — Telephone Encounter (Signed)
Will route to Dr. Fry as FYI.  

## 2016-03-11 NOTE — Telephone Encounter (Signed)
Pt has lost his rx LORazepam (ATIVAN) 1 MG tablet  Pt requesting a 3 day early refill. Wife states she thinks she threw it away. Pharmacy would like a call to ok it for today.  Please disregard the request for the oxycodone. That is through pain management.

## 2016-03-12 NOTE — Telephone Encounter (Signed)
NO early refills. He will need an OV before any refills are given

## 2016-03-12 NOTE — Telephone Encounter (Signed)
Can you call and offer appointment?

## 2016-03-12 NOTE — Telephone Encounter (Signed)
Pharmacy call to see if they can do early refill on lorazepam.

## 2016-03-17 NOTE — Telephone Encounter (Signed)
Pt will call back to schedule

## 2016-06-25 ENCOUNTER — Other Ambulatory Visit: Payer: Self-pay | Admitting: Family Medicine

## 2016-06-29 NOTE — Telephone Encounter (Signed)
Call in #90 with 5 rf 

## 2016-07-28 DIAGNOSIS — M62830 Muscle spasm of back: Secondary | ICD-10-CM | POA: Diagnosis not present

## 2016-07-28 DIAGNOSIS — M47812 Spondylosis without myelopathy or radiculopathy, cervical region: Secondary | ICD-10-CM | POA: Diagnosis not present

## 2016-07-28 DIAGNOSIS — G894 Chronic pain syndrome: Secondary | ICD-10-CM | POA: Diagnosis not present

## 2016-08-07 ENCOUNTER — Emergency Department
Admission: EM | Admit: 2016-08-07 | Discharge: 2016-08-07 | Disposition: A | Discharge status: Home / Self Care | Payer: Commercial Managed Care - HMO | Source: Home / Self Care | Attending: Family Medicine | Admitting: Family Medicine

## 2016-08-07 ENCOUNTER — Encounter: Payer: Self-pay | Admitting: Emergency Medicine

## 2016-08-07 DIAGNOSIS — J209 Acute bronchitis, unspecified: Secondary | ICD-10-CM

## 2016-08-07 MED ORDER — GUAIFENESIN-CODEINE 100-10 MG/5ML PO SOLN: 5.0000 mL | Freq: Three times a day (TID) | ORAL | 0 refills | Status: DC | PRN

## 2016-08-07 MED ORDER — AZITHROMYCIN 250 MG PO TABS: 250.0000 mg | ORAL_TABLET | Freq: Every day | ORAL | 0 refills | Status: DC

## 2016-08-07 MED ORDER — BENZONATATE 100 MG PO CAPS: 100.0000 mg | ORAL_CAPSULE | Freq: Three times a day (TID) | ORAL | 0 refills | Status: DC

## 2016-08-07 NOTE — Discharge Instructions (Signed)
°  Guaifenesin-codeine is a narcotic cough medication that can cause drowsiness. Do not drive, drink alcohol, or take other sedating medications such as benadryl or nyquil while taking this cough medication.  You may take at night when trying to sleep.

## 2016-08-07 NOTE — ED Triage Notes (Signed)
Pt c/o cough with mucous and chest congestion for 1 week. Denies fever .

## 2016-08-07 NOTE — ED Provider Notes (Signed)
CSN: [id number]     Arrival date & time 08/07/16  1306 History   First MD Initiated Contact with Patient 08/07/16 1340     Chief Complaint  Patient presents with  . Cough   (Consider location/radiation/quality/duration/timing/severity/associated sxs/prior Treatment) HPI  Doree FudgeFrank J Kiker is a 41 y.o. male presenting to UC with c/o 1 week of worsening chest congestion, soreness from cough, productive cough, sinus pressure and mild sore throat. Denies fever, chills, n/v/d. Has tried OTC cough/cold medication with mild temporary relief.    Past Medical History:  Diagnosis Date  . Chickenpox   . Chronic neck pain    sees Dr. Nickola Majoralton-Bethea   . Febrile seizure (HCC)   . 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 No pertinent surgical history. Family History  Problem Relation Age of Onset  . Hypertension Other   . Mental illness Other    Social History  Substance Use Topics  . Smoking status: Never Smoker  . Smokeless tobacco: Never Used  . Alcohol use No    Review of Systems  Constitutional: Positive for chills. Negative for fever.  HENT: Positive for congestion, rhinorrhea, sinus pain and sinus pressure. Negative for ear pain, sore throat, trouble swallowing and voice change.   Respiratory: Positive for cough. Negative for shortness of breath.   Cardiovascular: Negative for chest pain and palpitations.  Gastrointestinal: Positive for nausea and vomiting. Negative for abdominal pain and diarrhea.  Musculoskeletal: Positive for arthralgias and myalgias. Negative for back pain.       Body aches  Skin: Negative for rash.    Allergies  Patient has no known allergies.  Home Medications   Prior to Admission medications   Medication Sig Start Date End Date Taking? Authorizing Provider  azithromycin (ZITHROMAX) 250 MG tablet Take 1 tablet (250 mg total) by mouth daily. Take first 2 tablets together, then 1 every day until finished. 08/07/16   Junius FinnerErin O'Malley, PA-C   benzonatate (TESSALON) 100 MG capsule Take 1-2 capsules (100-200 mg total) by mouth every 8 (eight) hours. 08/07/16   Junius FinnerErin O'Malley, PA-C  doxycycline (VIBRA-TABS) 100 MG tablet Take 1 tablet (100 mg total) by mouth 2 (two) times daily. 02/19/16   Helane GuntherGregory Mayer, DPM  guaiFENesin-codeine 100-10 MG/5ML syrup Take 5 mLs by mouth 3 (three) times daily as needed for cough. 08/07/16   Junius FinnerErin O'Malley, PA-C  LORazepam (ATIVAN) 1 MG tablet TAKE 1 TABLET BY MOUTH EVERY 8 HOURS AS NEEDED 06/30/16   Nelwyn SalisburyStephen A Fry, MD  metoprolol succinate (TOPROL-XL) 50 MG 24 hr tablet Take 1 tablet (50 mg total) by mouth daily. Take with or immediately following a meal. 03/14/14   Nelwyn SalisburyStephen A Fry, MD  oxyCODONE-acetaminophen (PERCOCET) 7.5-325 MG per tablet Take 1 tablet by mouth every 4 (four) hours as needed. 03/07/14   Historical Provider, MD   Meds Ordered and Administered this Visit  Medications - No data to display  BP 118/79 (BP Location: Right Arm)   Pulse 104   Temp 98.9 F (37.2 C) (Oral)   Wt 233 lb (105.7 kg)   SpO2 97%   BMI 32.50 kg/m  No data found.   Physical Exam  Constitutional: He is oriented to person, place, and time. He appears well-developed and well-nourished. No distress.  HENT:  Head: Normocephalic and atraumatic.  Right Ear: Tympanic membrane normal.  Left Ear: Tympanic membrane normal.  Nose: Mucosal edema present. Right sinus exhibits no maxillary sinus tenderness and no frontal sinus tenderness. Left  sinus exhibits no maxillary sinus tenderness and no frontal sinus tenderness.  Mouth/Throat: Uvula is midline, oropharynx is clear and moist and mucous membranes are normal.  Eyes: EOM are normal.  Neck: Normal range of motion. Neck supple.  Cardiovascular: Normal rate and regular rhythm.   Pulmonary/Chest: Effort normal. No stridor. No respiratory distress. He has no wheezes. He has rhonchi ( faint, diffuse, clears with cough). He has no rales.  Musculoskeletal: Normal range of motion.   Lymphadenopathy:    He has no cervical adenopathy.  Neurological: He is alert and oriented to person, place, and time.  Skin: Skin is warm and dry. He is not diaphoretic.  Psychiatric: He has a normal mood and affect. His behavior is normal.  Nursing note and vitals reviewed.   Urgent Care Course   Clinical Course     Procedures (including critical care time)  Labs Review Labs Reviewed - No data to display  Imaging Review No results found.    MDM   1. Acute bronchitis, unspecified organism    Pt c/o 1 week of worsening URI symptoms. Faint diffuse rhonchi on exam, clears with cough. No respiratory distress. Due to worsening symptoms despite symptomatic treatment, will cover for bacterial cause.  Rx: Azithromycin, tessalon and guaifenesin-codeine for evening cough.  F/u with PCP in 1 week if not improving.    Junius Finner, PA-C 08/07/16 1423

## 2016-09-29 DIAGNOSIS — M62830 Muscle spasm of back: Secondary | ICD-10-CM | POA: Diagnosis not present

## 2016-09-29 DIAGNOSIS — M47812 Spondylosis without myelopathy or radiculopathy, cervical region: Secondary | ICD-10-CM | POA: Diagnosis not present

## 2016-09-29 DIAGNOSIS — G894 Chronic pain syndrome: Secondary | ICD-10-CM | POA: Diagnosis not present

## 2016-12-30 DIAGNOSIS — G894 Chronic pain syndrome: Secondary | ICD-10-CM | POA: Diagnosis not present

## 2016-12-30 DIAGNOSIS — M47812 Spondylosis without myelopathy or radiculopathy, cervical region: Secondary | ICD-10-CM | POA: Diagnosis not present

## 2016-12-30 DIAGNOSIS — M62830 Muscle spasm of back: Secondary | ICD-10-CM | POA: Diagnosis not present

## 2017-02-14 ENCOUNTER — Ambulatory Visit (INDEPENDENT_AMBULATORY_CARE_PROVIDER_SITE_OTHER): Payer: 59 | Admitting: Family Medicine

## 2017-02-14 ENCOUNTER — Encounter: Payer: Self-pay | Admitting: Family Medicine

## 2017-02-14 VITALS — BP 127/88 | HR 64 | Temp 98.0°F | Ht 71.0 in | Wt 221.0 lb

## 2017-02-14 DIAGNOSIS — Z0000 Encounter for general adult medical examination without abnormal findings: Secondary | ICD-10-CM

## 2017-02-14 LAB — POC URINALSYSI DIPSTICK (AUTOMATED)
Bilirubin, UA: NEGATIVE
Blood, UA: NEGATIVE
GLUCOSE UA: NEGATIVE
Ketones, UA: NEGATIVE
LEUKOCYTES UA: NEGATIVE
Nitrite, UA: NEGATIVE
PROTEIN UA: NEGATIVE
Spec Grav, UA: 1.01 (ref 1.010–1.025)
UROBILINOGEN UA: 0.2 U/dL
pH, UA: 6 (ref 5.0–8.0)

## 2017-02-14 LAB — HEPATIC FUNCTION PANEL
ALK PHOS: 40 U/L (ref 39–117)
ALT: 36 U/L (ref 0–53)
AST: 21 U/L (ref 0–37)
Albumin: 4.8 g/dL (ref 3.5–5.2)
BILIRUBIN DIRECT: 0.1 mg/dL (ref 0.0–0.3)
BILIRUBIN TOTAL: 0.6 mg/dL (ref 0.2–1.2)
TOTAL PROTEIN: 7.1 g/dL (ref 6.0–8.3)

## 2017-02-14 LAB — LIPID PANEL
CHOL/HDL RATIO: 4
Cholesterol: 169 mg/dL (ref 0–200)
HDL: 38.9 mg/dL — AB (ref >39.00)
LDL Cholesterol: 102 mg/dL — ABNORMAL HIGH (ref 0–99)
NONHDL: 130.56
Triglycerides: 141 mg/dL (ref 0.0–149.0)
VLDL: 28.2 mg/dL (ref 0.0–40.0)

## 2017-02-14 LAB — CBC WITH DIFFERENTIAL/PLATELET
BASOS PCT: 1.1 % (ref 0.0–3.0)
Basophils Absolute: 0.1 10*3/uL (ref 0.0–0.1)
Eosinophils Absolute: 0.1 10*3/uL (ref 0.0–0.7)
Eosinophils Relative: 1.4 % (ref 0.0–5.0)
HCT: 41.7 % (ref 39.0–52.0)
HEMOGLOBIN: 14 g/dL (ref 13.0–17.0)
Lymphocytes Relative: 30.5 % (ref 12.0–46.0)
Lymphs Abs: 1.9 10*3/uL (ref 0.7–4.0)
MCHC: 33.7 g/dL (ref 30.0–36.0)
MCV: 80.2 fl (ref 78.0–100.0)
MONOS PCT: 6.6 % (ref 3.0–12.0)
Monocytes Absolute: 0.4 10*3/uL (ref 0.1–1.0)
Neutro Abs: 3.7 10*3/uL (ref 1.4–7.7)
Neutrophils Relative %: 60.4 % (ref 43.0–77.0)
Platelets: 241 10*3/uL (ref 150.0–400.0)
RBC: 5.2 Mil/uL (ref 4.22–5.81)
RDW: 12.9 % (ref 11.5–15.5)
WBC: 6.2 10*3/uL (ref 4.0–10.5)

## 2017-02-14 LAB — BASIC METABOLIC PANEL
BUN: 14 mg/dL (ref 6–23)
CO2: 29 mEq/L (ref 19–32)
CREATININE: 1.04 mg/dL (ref 0.40–1.50)
Calcium: 10.1 mg/dL (ref 8.4–10.5)
Chloride: 103 mEq/L (ref 96–112)
GFR: 83.09 mL/min (ref >60.00)
Glucose, Bld: 93 mg/dL (ref 70–99)
POTASSIUM: 5.1 meq/L (ref 3.5–5.1)
SODIUM: 139 meq/L (ref 135–145)

## 2017-02-14 LAB — TSH: TSH: 0.83 u[IU]/mL (ref 0.35–4.50)

## 2017-02-14 NOTE — Patient Instructions (Signed)
WE NOW OFFER   [facility] FAST TRACK!!!  SAME DAY Appointments for ACUTE CARE  Such as: Sprains, Injuries, cuts, abrasions, rashes, muscle pain, joint pain, back pain Colds, flu, sore throats, headache, allergies, cough, fever  Ear pain, sinus and eye infections Abdominal pain, nausea, vomiting, diarrhea, upset stomach Animal/insect bites  3 Easy Ways to Schedule: Walk-In Scheduling Call in scheduling Mychart Sign-up: [email or web address]         

## 2017-02-14 NOTE — Progress Notes (Signed)
   Subjective:    Patient ID: Jesse Reese, male    DOB: 04/05/1976, 41 y.o.   MRN: [id number]  HPI Here for a well exam. He feels great. He stopped taking Metoprolol a few months ago and his BP has been stable since then.    Review of Systems  Constitutional: Negative.   HENT: Negative.   Eyes: Negative.   Respiratory: Negative.   Cardiovascular: Negative.   Gastrointestinal: Negative.   Genitourinary: Negative.   Musculoskeletal: Positive for neck pain and neck stiffness. Negative for back pain, gait problem and joint swelling.  Skin: Negative.   Neurological: Negative.   Psychiatric/Behavioral: Negative.        Objective:   Physical Exam  Constitutional: He is oriented to person, place, and time. He appears well-developed and well-nourished. No distress.  HENT:  Head: Normocephalic and atraumatic.  Right Ear: External ear normal.  Left Ear: External ear normal.  Nose: Nose normal.  Mouth/Throat: Oropharynx is clear and moist. No oropharyngeal exudate.  Eyes: Pupils are equal, round, and reactive to light. Conjunctivae and EOM are normal. Right eye exhibits no discharge. Left eye exhibits no discharge. No scleral icterus.  Neck: Neck supple. No JVD present. No tracheal deviation present. No thyromegaly present.  Cardiovascular: Normal rate, regular rhythm, normal heart sounds and intact distal pulses.  Exam reveals no gallop and no friction rub.   No murmur heard. Pulmonary/Chest: Effort normal and breath sounds normal. No respiratory distress. He has no wheezes. He has no rales. He exhibits no tenderness.  Abdominal: Soft. Bowel sounds are normal. He exhibits no distension and no mass. There is no tenderness. There is no rebound and no guarding.  Genitourinary: Rectum normal, prostate normal and penis normal. Rectal exam shows guaiac negative stool. No penile tenderness.  Musculoskeletal: Normal range of motion. He exhibits no edema or tenderness.  Lymphadenopathy:   He has no cervical adenopathy.  Neurological: He is alert and oriented to person, place, and time. He has normal reflexes. No cranial nerve deficit. He exhibits normal muscle tone. Coordination normal.  Skin: Skin is warm and dry. No rash noted. He is not diaphoretic. No erythema. No pallor.  Psychiatric: He has a normal mood and affect. His behavior is normal. Judgment and thought content normal.          Assessment & Plan:

## 2017-02-23 DIAGNOSIS — M47812 Spondylosis without myelopathy or radiculopathy, cervical region: Secondary | ICD-10-CM | POA: Diagnosis not present

## 2017-02-23 DIAGNOSIS — G894 Chronic pain syndrome: Secondary | ICD-10-CM | POA: Diagnosis not present

## 2017-02-23 DIAGNOSIS — M62830 Muscle spasm of back: Secondary | ICD-10-CM | POA: Diagnosis not present

## 2017-03-09 DIAGNOSIS — M791 Myalgia: Secondary | ICD-10-CM | POA: Diagnosis not present

## 2017-04-09 DIAGNOSIS — M791 Myalgia: Secondary | ICD-10-CM | POA: Diagnosis not present

## 2017-04-25 DIAGNOSIS — Z79891 Long term (current) use of opiate analgesic: Secondary | ICD-10-CM | POA: Diagnosis not present

## 2017-04-25 DIAGNOSIS — M47812 Spondylosis without myelopathy or radiculopathy, cervical region: Secondary | ICD-10-CM | POA: Diagnosis not present

## 2017-04-25 DIAGNOSIS — G894 Chronic pain syndrome: Secondary | ICD-10-CM | POA: Diagnosis not present

## 2017-04-25 DIAGNOSIS — M62830 Muscle spasm of back: Secondary | ICD-10-CM | POA: Diagnosis not present

## 2017-05-09 DIAGNOSIS — M791 Myalgia, unspecified site: Secondary | ICD-10-CM | POA: Diagnosis not present

## 2017-06-09 DIAGNOSIS — M791 Myalgia, unspecified site: Secondary | ICD-10-CM | POA: Diagnosis not present

## 2017-06-23 DIAGNOSIS — M62830 Muscle spasm of back: Secondary | ICD-10-CM | POA: Diagnosis not present

## 2017-06-23 DIAGNOSIS — G894 Chronic pain syndrome: Secondary | ICD-10-CM | POA: Diagnosis not present

## 2017-06-23 DIAGNOSIS — M47812 Spondylosis without myelopathy or radiculopathy, cervical region: Secondary | ICD-10-CM | POA: Diagnosis not present

## 2017-07-09 DIAGNOSIS — M791 Myalgia, unspecified site: Secondary | ICD-10-CM | POA: Diagnosis not present

## 2017-08-09 DIAGNOSIS — M791 Myalgia, unspecified site: Secondary | ICD-10-CM | POA: Diagnosis not present

## 2017-08-18 DIAGNOSIS — M62830 Muscle spasm of back: Secondary | ICD-10-CM | POA: Diagnosis not present

## 2017-08-18 DIAGNOSIS — G894 Chronic pain syndrome: Secondary | ICD-10-CM | POA: Diagnosis not present

## 2017-08-18 DIAGNOSIS — M47812 Spondylosis without myelopathy or radiculopathy, cervical region: Secondary | ICD-10-CM | POA: Diagnosis not present

## 2017-09-09 DIAGNOSIS — M791 Myalgia, unspecified site: Secondary | ICD-10-CM | POA: Diagnosis not present

## 2017-10-07 DIAGNOSIS — M791 Myalgia, unspecified site: Secondary | ICD-10-CM | POA: Diagnosis not present

## 2017-10-13 DIAGNOSIS — G894 Chronic pain syndrome: Secondary | ICD-10-CM | POA: Diagnosis not present

## 2017-10-13 DIAGNOSIS — M47812 Spondylosis without myelopathy or radiculopathy, cervical region: Secondary | ICD-10-CM | POA: Diagnosis not present

## 2017-10-13 DIAGNOSIS — M62830 Muscle spasm of back: Secondary | ICD-10-CM | POA: Diagnosis not present

## 2019-12-17 ENCOUNTER — Other Ambulatory Visit: Payer: Self-pay

## 2019-12-18 ENCOUNTER — Encounter: Payer: Self-pay | Admitting: Family Medicine

## 2019-12-18 ENCOUNTER — Ambulatory Visit (INDEPENDENT_AMBULATORY_CARE_PROVIDER_SITE_OTHER): Payer: 59 | Admitting: Family Medicine

## 2019-12-18 VITALS — BP 136/82 | HR 71 | Temp 97.6°F | Wt 217.5 lb

## 2019-12-18 DIAGNOSIS — R300 Dysuria: Secondary | ICD-10-CM | POA: Diagnosis not present

## 2019-12-18 LAB — POCT URINALYSIS DIPSTICK
Bilirubin, UA: NEGATIVE
Blood, UA: NEGATIVE
Glucose, UA: NEGATIVE
Ketones, UA: NEGATIVE
Leukocytes, UA: NEGATIVE
Nitrite, UA: NEGATIVE
Protein, UA: NEGATIVE
Spec Grav, UA: 1.015 (ref 1.010–1.025)
Urobilinogen, UA: 0.2 E.U./dL
pH, UA: 6 (ref 5.0–8.0)

## 2019-12-18 NOTE — Patient Instructions (Signed)

## 2019-12-18 NOTE — Progress Notes (Signed)
  Subjective:     Patient ID: Jesse Reese, male   DOB: 08-27-1975, 44 y.o.   MRN: [id number]  HPI [name] is seen today as a work in with 3 to 4-day history of some dysuria.  Is had some burning with urination somewhat intermittent.  His wife had some Azo when he took that and that has provided some relief.  He denies any flank pain.  No fever.  No chills.  No nausea or vomiting.  No suprapubic pain.  No history of kidney stones.  No penile discharge.  Good urine flow.  He is monogamous and denies risk for STD.  He states he had similar episode about 5 or 6 years ago that eventually resolved on its own.  No history of prostatitis.  No recent change of soaps.  No new medications.  Past Medical History:  Diagnosis Date  . Chickenpox   . Chronic neck pain    sees Dr. Nickola Major   . Febrile seizure (HCC)   . 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  reports that he has never smoked. He has never used smokeless tobacco. He reports that he does not drink alcohol or use drugs. family history includes Hypertension in an other family member; Mental illness in an other family member. No Known Allergies\   Review of Systems  Constitutional: Negative for chills and fever.  Gastrointestinal: Negative for abdominal pain.  Genitourinary: Positive for dysuria. Negative for discharge, flank pain, hematuria, scrotal swelling and testicular pain.       Objective:   Physical Exam Vitals reviewed.  Constitutional:      Appearance: Normal appearance.  Cardiovascular:     Rate and Rhythm: Normal rate and regular rhythm.  Pulmonary:     Effort: Pulmonary effort is normal.     Breath sounds: Normal breath sounds.  Neurological:     Mental Status: He is alert.        Assessment:     Dysuria.  Etiology unclear.  Urine dipstick is completely normal.  He states he has no significant risk factors for STD.  Similar symptoms previously which cleared with Azo    Plan:      -Stay well-hydrated -Continue over-the-counter Azo as needed -Touch base if symptoms not fully clearing over the next 3 to 4 days  Kristian Covey MD [facility]

## 2020-10-27 ENCOUNTER — Ambulatory Visit (INDEPENDENT_AMBULATORY_CARE_PROVIDER_SITE_OTHER): Payer: 59 | Admitting: Podiatry

## 2020-10-27 ENCOUNTER — Other Ambulatory Visit: Payer: Self-pay

## 2020-10-27 ENCOUNTER — Encounter: Payer: Self-pay | Admitting: Podiatry

## 2020-10-27 ENCOUNTER — Ambulatory Visit (INDEPENDENT_AMBULATORY_CARE_PROVIDER_SITE_OTHER): Payer: 59

## 2020-10-27 DIAGNOSIS — M6788 Other specified disorders of synovium and tendon, other site: Secondary | ICD-10-CM

## 2020-10-27 DIAGNOSIS — M7731 Calcaneal spur, right foot: Secondary | ICD-10-CM

## 2020-10-27 DIAGNOSIS — M722 Plantar fascial fibromatosis: Secondary | ICD-10-CM | POA: Diagnosis not present

## 2020-10-27 DIAGNOSIS — M216X1 Other acquired deformities of right foot: Secondary | ICD-10-CM | POA: Diagnosis not present

## 2020-10-27 DIAGNOSIS — M62461 Contracture of muscle, right lower leg: Secondary | ICD-10-CM

## 2020-10-27 DIAGNOSIS — M6528 Calcific tendinitis, other site: Secondary | ICD-10-CM

## 2020-10-27 DIAGNOSIS — M7661 Achilles tendinitis, right leg: Secondary | ICD-10-CM

## 2020-10-27 DIAGNOSIS — M21861 Other specified acquired deformities of right lower leg: Secondary | ICD-10-CM

## 2020-10-27 NOTE — Progress Notes (Signed)
  Subjective:  Patient ID: Jesse Reese, male    DOB: 1975/12/08,  MRN: [id number]  Chief Complaint  Patient presents with  . Foot Pain      (np) patient requesting injection right heel pain     45 y.o. male presents with the above complaint. History confirmed with patient. Present for over 1 year, he previously saw another doctor for this who recommended physical therapy. He did home exercises which helped some. He is active and likes to golf. No recent trauma, has been getting worse though.   Objective:  Physical Exam: warm, good capillary refill, no trophic changes or ulcerative lesions, normal DP and PT pulses and normal sensory exam.   Right Foot: gastrocnemius equinus, there is a palpable mass in the posterior heel which is painful to palpation, negative Thompson test   Radiographs: X-ray of the right foot: large posterior calcaneal spur which has fractured and with partial retraction Assessment:   1. Plantar fasciitis of right foot      Plan:  Patient was evaluated and treated and all questions answered.  Reviewed x-ray with patient. Discussed treatment options with him. I do not think this will improve with conservative care at this point and with retraction of the fragment I have concerns he is at risk of avulsion/rupture. Recommend immobilization in CAM boot which was dispensed and MRI for surgical planning for resection and detachment/reattachment with debridement/repair and gastrocnemius recession. Tentatively will plan for April 29, he will discuss with his wife. Advised to avoid impactful activity at this point. Discussed rupture risk and signs.  Return in about 3 weeks (around 11/17/2020) for after MRI to review.

## 2020-11-03 ENCOUNTER — Telehealth: Payer: Self-pay | Admitting: Urology

## 2020-11-03 NOTE — Telephone Encounter (Signed)
DOS - 11/21/20  REPAIR POST TIBIAL TENDON RIGHT --- 83151 GASTROCNEMIUS RECESS RIGHT --- 76160 CALCANEAL OSTEOTOMY RIGHT --- 73710  [facility] EFFECTIVE DATE - 01/24/20  PLAN DEDUCTIBLE - $3,250.00 W/ $2,713.97 REMAINING OUT OF POCKET - $8,000.00 W/  $6,269.48 REMAINING COINSURANCE - 50% COPAY -  $0.00   PER [employer] WEBSITE FOR CPT CODES 54627, [phone] AND  28118 HAS BEEN APPROVED, AUTH #[id number].

## 2020-11-13 ENCOUNTER — Telehealth: Payer: Self-pay | Admitting: Podiatry

## 2020-11-13 ENCOUNTER — Other Ambulatory Visit: Payer: 59

## 2020-11-13 NOTE — Telephone Encounter (Signed)
Vance Gather and I are handling this

## 2020-11-13 NOTE — Telephone Encounter (Signed)
Patient canceled appointment 4/25 due to issues getting MRI approved. DR. Lilian Kapur asked me to call this patient back to reschedule him since he was scheduled to have surgery 4/29 (post op visit were scheduled as well). When I called the patient back, he stated he never agreed to surgery yet, nor did he sign any forms regarding surgery. The appointment for 4/25 was to discuss surgery after receiving MRI results. MRI still has not been approved or denied (awaiting approval be Wellsite geologist). Mcdonald requested patient be scheduled for Tuesday or Thursday but if there is no MRI results to discuss, can not move forward. Please advise.

## 2020-11-13 NOTE — Telephone Encounter (Signed)
The MRI can still get approved within the next 5-7 days, the MRI is pending. The pending does not mean that the MRI was not approved nor denial. According the the notes Dr. Lilian Kapur told the patient that they will Tentatively plan for April 29 and the patient will he discuss with his wife. The provider also advised to avoid impactful activity at this point and discussed rupture risk and signs.

## 2020-11-13 NOTE — Telephone Encounter (Signed)
Ok. When MRI had been approved I will call patient to schedule MRI follow up.

## 2020-11-17 ENCOUNTER — Ambulatory Visit: Payer: 59 | Admitting: Podiatry

## 2020-11-27 ENCOUNTER — Encounter: Payer: 59 | Admitting: Podiatry

## 2020-12-04 ENCOUNTER — Encounter: Payer: 59 | Admitting: Podiatry

## 2020-12-23 ENCOUNTER — Encounter: Payer: 59 | Admitting: Podiatry

## 2021-02-06 DIAGNOSIS — M47812 Spondylosis without myelopathy or radiculopathy, cervical region: Secondary | ICD-10-CM | POA: Diagnosis not present

## 2021-02-06 DIAGNOSIS — G894 Chronic pain syndrome: Secondary | ICD-10-CM | POA: Diagnosis not present

## 2021-02-06 DIAGNOSIS — M62830 Muscle spasm of back: Secondary | ICD-10-CM | POA: Diagnosis not present

## 2021-02-06 DIAGNOSIS — Z79891 Long term (current) use of opiate analgesic: Secondary | ICD-10-CM | POA: Diagnosis not present

## 2021-04-14 DIAGNOSIS — Z79891 Long term (current) use of opiate analgesic: Secondary | ICD-10-CM | POA: Diagnosis not present

## 2021-04-14 DIAGNOSIS — M62830 Muscle spasm of back: Secondary | ICD-10-CM | POA: Diagnosis not present

## 2021-04-14 DIAGNOSIS — G894 Chronic pain syndrome: Secondary | ICD-10-CM | POA: Diagnosis not present

## 2021-04-14 DIAGNOSIS — M47812 Spondylosis without myelopathy or radiculopathy, cervical region: Secondary | ICD-10-CM | POA: Diagnosis not present

## 2021-06-12 DIAGNOSIS — M62830 Muscle spasm of back: Secondary | ICD-10-CM | POA: Diagnosis not present

## 2021-06-12 DIAGNOSIS — M47812 Spondylosis without myelopathy or radiculopathy, cervical region: Secondary | ICD-10-CM | POA: Diagnosis not present

## 2021-06-12 DIAGNOSIS — G894 Chronic pain syndrome: Secondary | ICD-10-CM | POA: Diagnosis not present

## 2021-06-12 DIAGNOSIS — Z79891 Long term (current) use of opiate analgesic: Secondary | ICD-10-CM | POA: Diagnosis not present

## 2021-08-14 DIAGNOSIS — G894 Chronic pain syndrome: Secondary | ICD-10-CM | POA: Diagnosis not present

## 2021-08-14 DIAGNOSIS — Z79891 Long term (current) use of opiate analgesic: Secondary | ICD-10-CM | POA: Diagnosis not present

## 2021-08-14 DIAGNOSIS — M47812 Spondylosis without myelopathy or radiculopathy, cervical region: Secondary | ICD-10-CM | POA: Diagnosis not present

## 2021-08-14 DIAGNOSIS — M62830 Muscle spasm of back: Secondary | ICD-10-CM | POA: Diagnosis not present

## 2021-10-09 DIAGNOSIS — G894 Chronic pain syndrome: Secondary | ICD-10-CM | POA: Diagnosis not present

## 2021-10-09 DIAGNOSIS — Z79891 Long term (current) use of opiate analgesic: Secondary | ICD-10-CM | POA: Diagnosis not present

## 2021-10-09 DIAGNOSIS — M62830 Muscle spasm of back: Secondary | ICD-10-CM | POA: Diagnosis not present

## 2021-10-09 DIAGNOSIS — M47812 Spondylosis without myelopathy or radiculopathy, cervical region: Secondary | ICD-10-CM | POA: Diagnosis not present

## 2021-12-11 DIAGNOSIS — Z79891 Long term (current) use of opiate analgesic: Secondary | ICD-10-CM | POA: Diagnosis not present

## 2021-12-11 DIAGNOSIS — M62830 Muscle spasm of back: Secondary | ICD-10-CM | POA: Diagnosis not present

## 2021-12-11 DIAGNOSIS — M47812 Spondylosis without myelopathy or radiculopathy, cervical region: Secondary | ICD-10-CM | POA: Diagnosis not present

## 2021-12-11 DIAGNOSIS — G894 Chronic pain syndrome: Secondary | ICD-10-CM | POA: Diagnosis not present

## 2022-02-05 DIAGNOSIS — G894 Chronic pain syndrome: Secondary | ICD-10-CM | POA: Diagnosis not present

## 2022-02-05 DIAGNOSIS — M62830 Muscle spasm of back: Secondary | ICD-10-CM | POA: Diagnosis not present

## 2022-02-05 DIAGNOSIS — M47812 Spondylosis without myelopathy or radiculopathy, cervical region: Secondary | ICD-10-CM | POA: Diagnosis not present

## 2022-02-05 DIAGNOSIS — Z79891 Long term (current) use of opiate analgesic: Secondary | ICD-10-CM | POA: Diagnosis not present

## 2022-02-16 ENCOUNTER — Ambulatory Visit: Payer: BC Managed Care – PPO | Admitting: Family Medicine

## 2022-02-16 ENCOUNTER — Encounter: Payer: Self-pay | Admitting: Family Medicine

## 2022-02-16 ENCOUNTER — Telehealth: Payer: Self-pay | Admitting: Family Medicine

## 2022-02-16 VITALS — BP 130/90 | HR 82 | Temp 98.7°F | Ht 71.0 in | Wt 218.4 lb

## 2022-02-16 DIAGNOSIS — J40 Bronchitis, not specified as acute or chronic: Secondary | ICD-10-CM

## 2022-02-16 DIAGNOSIS — R059 Cough, unspecified: Secondary | ICD-10-CM | POA: Diagnosis not present

## 2022-02-16 MED ORDER — AZITHROMYCIN 250 MG PO TABS: ORAL_TABLET | ORAL | 0 refills | Status: DC

## 2022-02-16 MED ORDER — HYDROCODONE BIT-HOMATROP MBR 5-1.5 MG/5ML PO SOLN: 5.0000 mL | ORAL | 0 refills | Status: DC | PRN

## 2022-02-16 NOTE — Progress Notes (Unsigned)
   Subjective:    Patient ID: Jesse Reese, male    DOB: [birth year], 46 y.o.   MRN: [id number]  HPI Here for 5 days of chest tightness and a dry cough. No fever or SOB or ST. Taking Nyqul and Dayquil.    Review of Systems  Constitutional:  Negative for fever.  HENT:  Negative for congestion, ear pain, postnasal drip, sinus pain and sore throat.   Eyes: Negative.   Respiratory:  Positive for cough, chest tightness and wheezing. Negative for shortness of breath.   Gastrointestinal: Negative.        Objective:   Physical Exam Constitutional:      Appearance: Normal appearance. He is not ill-appearing.  HENT:     Right Ear: Tympanic membrane, ear canal and external ear normal.     Left Ear: Tympanic membrane, ear canal and external ear normal.     Nose: Nose normal.     Mouth/Throat:     Pharynx: Oropharynx is clear.  Eyes:     Conjunctiva/sclera: Conjunctivae normal.  Pulmonary:     Effort: Pulmonary effort is normal.     Breath sounds: Normal breath sounds.  Lymphadenopathy:     Cervical: No cervical adenopathy.  Neurological:     Mental Status: He is alert.           Assessment & Plan:  Bronchitis, treat with a Zpack.  Gershon Crane, MD

## 2022-02-16 NOTE — Telephone Encounter (Signed)
Patient calling in with respiratory symptoms: Shortness of breath, chest pain, palpitations or other red words send to Triage  Does the patient have a fever over 100, cough, congestion, sore throat, runny nose, lost of taste/smell (please list symptoms that patient has)? COUGH  What date did symptoms start?02-12-2022 (If over 5 days ago, pt may be scheduled for in person visit)  Have you tested for Covid in the last 5 days? Yes   If yes, was it positive []  OR negative [x] ? If positive in the last 5 days, please schedule virtual visit now. If negative, schedule for an in person OV with the next available provider if PCP has no openings. Please also let patient know they will be tested again (follow the script below)  "you will have to arrive prior to your appt time to be Covid tested. Please park in back of office at the cone & call [phone] to let the staff know you have arrived. A staff member will meet you at your car to do a rapid covid test. Once the test has resulted you will be notified by phone of your results to determine if appt will remain an in person visit or be converted to a virtual/phone visit. If you arrive less than before your appt time, your visit will be automatically converted to virtual & any recommended testing will happen AFTER the visit." Pt has been sch 02/16/2022   THINGS TO REMEMBER  If no availability for virtual visit in office,  please schedule another [place] office  If no availability at another [place] office, please instruct patient that they can schedule an evisit or virtual visit through their mychart account. Visits up to 8pm  patients can be seen in office 5 days after positive COVID test

## 2022-02-17 LAB — POC COVID19 BINAXNOW: SARS Coronavirus 2 Ag: NEGATIVE

## 2022-02-17 LAB — POCT INFLUENZA A/B
Influenza A, POC: NEGATIVE
Influenza B, POC: NEGATIVE

## 2022-04-02 DIAGNOSIS — Z79891 Long term (current) use of opiate analgesic: Secondary | ICD-10-CM | POA: Diagnosis not present

## 2022-04-02 DIAGNOSIS — M62830 Muscle spasm of back: Secondary | ICD-10-CM | POA: Diagnosis not present

## 2022-04-02 DIAGNOSIS — M47812 Spondylosis without myelopathy or radiculopathy, cervical region: Secondary | ICD-10-CM | POA: Diagnosis not present

## 2022-04-02 DIAGNOSIS — G894 Chronic pain syndrome: Secondary | ICD-10-CM | POA: Diagnosis not present

## 2022-05-28 DIAGNOSIS — M47812 Spondylosis without myelopathy or radiculopathy, cervical region: Secondary | ICD-10-CM | POA: Diagnosis not present

## 2022-05-28 DIAGNOSIS — G894 Chronic pain syndrome: Secondary | ICD-10-CM | POA: Diagnosis not present

## 2022-05-28 DIAGNOSIS — Z79891 Long term (current) use of opiate analgesic: Secondary | ICD-10-CM | POA: Diagnosis not present

## 2022-05-28 DIAGNOSIS — M62830 Muscle spasm of back: Secondary | ICD-10-CM | POA: Diagnosis not present

## 2022-08-06 DIAGNOSIS — M62830 Muscle spasm of back: Secondary | ICD-10-CM | POA: Diagnosis not present

## 2022-08-06 DIAGNOSIS — G894 Chronic pain syndrome: Secondary | ICD-10-CM | POA: Diagnosis not present

## 2022-08-06 DIAGNOSIS — Z79891 Long term (current) use of opiate analgesic: Secondary | ICD-10-CM | POA: Diagnosis not present

## 2022-08-06 DIAGNOSIS — M47812 Spondylosis without myelopathy or radiculopathy, cervical region: Secondary | ICD-10-CM | POA: Diagnosis not present

## 2022-10-01 DIAGNOSIS — M47812 Spondylosis without myelopathy or radiculopathy, cervical region: Secondary | ICD-10-CM | POA: Diagnosis not present

## 2022-10-01 DIAGNOSIS — Z79891 Long term (current) use of opiate analgesic: Secondary | ICD-10-CM | POA: Diagnosis not present

## 2022-10-01 DIAGNOSIS — G894 Chronic pain syndrome: Secondary | ICD-10-CM | POA: Diagnosis not present

## 2022-10-01 DIAGNOSIS — M62830 Muscle spasm of back: Secondary | ICD-10-CM | POA: Diagnosis not present

## 2022-12-03 DIAGNOSIS — Z79891 Long term (current) use of opiate analgesic: Secondary | ICD-10-CM | POA: Diagnosis not present

## 2022-12-03 DIAGNOSIS — M47812 Spondylosis without myelopathy or radiculopathy, cervical region: Secondary | ICD-10-CM | POA: Diagnosis not present

## 2022-12-03 DIAGNOSIS — G894 Chronic pain syndrome: Secondary | ICD-10-CM | POA: Diagnosis not present

## 2022-12-03 DIAGNOSIS — M62830 Muscle spasm of back: Secondary | ICD-10-CM | POA: Diagnosis not present

## 2023-02-04 DIAGNOSIS — Z79891 Long term (current) use of opiate analgesic: Secondary | ICD-10-CM | POA: Diagnosis not present

## 2023-02-04 DIAGNOSIS — M62830 Muscle spasm of back: Secondary | ICD-10-CM | POA: Diagnosis not present

## 2023-02-04 DIAGNOSIS — G894 Chronic pain syndrome: Secondary | ICD-10-CM | POA: Diagnosis not present

## 2023-02-04 DIAGNOSIS — M47812 Spondylosis without myelopathy or radiculopathy, cervical region: Secondary | ICD-10-CM | POA: Diagnosis not present

## 2023-04-01 DIAGNOSIS — M62830 Muscle spasm of back: Secondary | ICD-10-CM | POA: Diagnosis not present

## 2023-04-01 DIAGNOSIS — G894 Chronic pain syndrome: Secondary | ICD-10-CM | POA: Diagnosis not present

## 2023-04-01 DIAGNOSIS — M47812 Spondylosis without myelopathy or radiculopathy, cervical region: Secondary | ICD-10-CM | POA: Diagnosis not present

## 2023-04-01 DIAGNOSIS — Z79891 Long term (current) use of opiate analgesic: Secondary | ICD-10-CM | POA: Diagnosis not present

## 2023-06-03 DIAGNOSIS — M47812 Spondylosis without myelopathy or radiculopathy, cervical region: Secondary | ICD-10-CM | POA: Diagnosis not present

## 2023-06-03 DIAGNOSIS — M62830 Muscle spasm of back: Secondary | ICD-10-CM | POA: Diagnosis not present

## 2023-06-03 DIAGNOSIS — G894 Chronic pain syndrome: Secondary | ICD-10-CM | POA: Diagnosis not present

## 2023-06-03 DIAGNOSIS — Z79891 Long term (current) use of opiate analgesic: Secondary | ICD-10-CM | POA: Diagnosis not present

## 2023-08-04 DIAGNOSIS — M62830 Muscle spasm of back: Secondary | ICD-10-CM | POA: Diagnosis not present

## 2023-08-04 DIAGNOSIS — G894 Chronic pain syndrome: Secondary | ICD-10-CM | POA: Diagnosis not present

## 2023-08-04 DIAGNOSIS — Z79891 Long term (current) use of opiate analgesic: Secondary | ICD-10-CM | POA: Diagnosis not present

## 2023-08-04 DIAGNOSIS — M47812 Spondylosis without myelopathy or radiculopathy, cervical region: Secondary | ICD-10-CM | POA: Diagnosis not present

## 2023-08-05 DIAGNOSIS — Z79891 Long term (current) use of opiate analgesic: Secondary | ICD-10-CM | POA: Diagnosis not present

## 2023-08-05 DIAGNOSIS — G894 Chronic pain syndrome: Secondary | ICD-10-CM | POA: Diagnosis not present

## 2023-09-01 ENCOUNTER — Ambulatory Visit (INDEPENDENT_AMBULATORY_CARE_PROVIDER_SITE_OTHER): Payer: BC Managed Care – PPO | Admitting: Family Medicine

## 2023-09-01 ENCOUNTER — Encounter: Payer: Self-pay | Admitting: Family Medicine

## 2023-09-01 VITALS — BP 110/70 | HR 73 | Temp 98.1°F | Ht 70.0 in | Wt 212.0 lb

## 2023-09-01 DIAGNOSIS — Z0000 Encounter for general adult medical examination without abnormal findings: Secondary | ICD-10-CM | POA: Diagnosis not present

## 2023-09-01 LAB — CBC WITH DIFFERENTIAL/PLATELET
Basophils Absolute: 0 10*3/uL (ref 0.0–0.1)
Basophils Relative: 0.8 % (ref 0.0–3.0)
Eosinophils Absolute: 0.1 10*3/uL (ref 0.0–0.7)
Eosinophils Relative: 1.9 % (ref 0.0–5.0)
HCT: 40.2 % (ref 39.0–52.0)
Hemoglobin: 13.7 g/dL (ref 13.0–17.0)
Lymphocytes Relative: 28.3 % (ref 12.0–46.0)
Lymphs Abs: 1.5 10*3/uL (ref 0.7–4.0)
MCHC: 34.2 g/dL (ref 30.0–36.0)
MCV: 78.4 fL (ref 78.0–100.0)
Monocytes Absolute: 0.5 10*3/uL (ref 0.1–1.0)
Monocytes Relative: 8.4 % (ref 3.0–12.0)
Neutro Abs: 3.3 10*3/uL (ref 1.4–7.7)
Neutrophils Relative %: 60.6 % (ref 43.0–77.0)
Platelets: 245 10*3/uL (ref 150.0–400.0)
RBC: 5.13 Mil/uL (ref 4.22–5.81)
RDW: 12.7 % (ref 11.5–15.5)
WBC: 5.4 10*3/uL (ref 4.0–10.5)

## 2023-09-01 LAB — BASIC METABOLIC PANEL
BUN: 15 mg/dL (ref 6–23)
CO2: 28 meq/L (ref 19–32)
Calcium: 9.3 mg/dL (ref 8.4–10.5)
Chloride: 103 meq/L (ref 96–112)
Creatinine, Ser: 1.05 mg/dL (ref 0.40–1.50)
GFR: 83.7 mL/min (ref >60.00)
Glucose, Bld: 76 mg/dL (ref 70–99)
Potassium: 4.2 meq/L (ref 3.5–5.1)
Sodium: 139 meq/L (ref 135–145)

## 2023-09-01 LAB — HEMOGLOBIN A1C: Hgb A1c MFr Bld: 5 % (ref 4.6–6.5)

## 2023-09-01 LAB — LIPID PANEL
Cholesterol: 146 mg/dL (ref 0–200)
HDL: 39.6 mg/dL (ref >39.00)
LDL Cholesterol: 86 mg/dL (ref 0–99)
NonHDL: 106.2
Total CHOL/HDL Ratio: 4
Triglycerides: 103 mg/dL (ref 0.0–149.0)
VLDL: 20.6 mg/dL (ref 0.0–40.0)

## 2023-09-01 LAB — HEPATIC FUNCTION PANEL
ALT: 19 U/L (ref 0–53)
AST: 17 U/L (ref 0–37)
Albumin: 4.7 g/dL (ref 3.5–5.2)
Alkaline Phosphatase: 52 U/L (ref 39–117)
Bilirubin, Direct: 0.1 mg/dL (ref 0.0–0.3)
Total Bilirubin: 0.6 mg/dL (ref 0.2–1.2)
Total Protein: 7.2 g/dL (ref 6.0–8.3)

## 2023-09-01 LAB — TSH: TSH: 0.93 u[IU]/mL (ref 0.35–5.50)

## 2023-09-01 MED ORDER — WEGOVY 2.4 MG/0.75ML ~~LOC~~ SOAJ: 2.4000 mg | SUBCUTANEOUS | Status: AC

## 2023-09-01 NOTE — Progress Notes (Signed)
 Subjective:    Patient ID: Jesse Reese, male    DOB: 1976-07-26, 48 y.o.   MRN: [id number]  HPI Here for a well exam. He feels fine. He has been taking Semaglutide  injections since November, and he has lost 20 lbs. He orders these off the Internet.    Review of Systems  Constitutional: Negative.   HENT: Negative.    Eyes: Negative.   Respiratory: Negative.    Cardiovascular: Negative.   Gastrointestinal: Negative.   Genitourinary: Negative.   Musculoskeletal: Negative.   Skin: Negative.   Neurological: Negative.   Psychiatric/Behavioral: Negative.         Objective:   Physical Exam Constitutional:      General: He is not in acute distress.    Appearance: Normal appearance. He is well-developed. He is not diaphoretic.  HENT:     Head: Normocephalic and atraumatic.     Right Ear: External ear normal.     Left Ear: External ear normal.     Nose: Nose normal.     Mouth/Throat:     Pharynx: No oropharyngeal exudate.  Eyes:     General: No scleral icterus.       Right eye: No discharge.        Left eye: No discharge.     Conjunctiva/sclera: Conjunctivae normal.     Pupils: Pupils are equal, round, and reactive to light.  Neck:     Thyroid : No thyromegaly.     Vascular: No JVD.     Trachea: No tracheal deviation.  Cardiovascular:     Rate and Rhythm: Normal rate and regular rhythm.     Pulses: Normal pulses.     Heart sounds: Normal heart sounds. No murmur heard.    No friction rub. No gallop.  Pulmonary:     Effort: Pulmonary effort is normal. No respiratory distress.     Breath sounds: Normal breath sounds. No wheezing or rales.  Chest:     Chest wall: No tenderness.  Abdominal:     General: Bowel sounds are normal. There is no distension.     Palpations: Abdomen is soft. There is no mass.     Tenderness: There is no abdominal tenderness. There is no guarding or rebound.  Genitourinary:    Penis: Normal. No tenderness.      Testes: Normal.   Musculoskeletal:        General: No tenderness. Normal range of motion.     Cervical back: Neck supple.  Lymphadenopathy:     Cervical: No cervical adenopathy.  Skin:    General: Skin is warm and dry.     Coloration: Skin is not pale.     Findings: No erythema or rash.  Neurological:     General: No focal deficit present.     Mental Status: He is alert and oriented to person, place, and time.     Cranial Nerves: No cranial nerve deficit.     Motor: No abnormal muscle tone.     Coordination: Coordination normal.     Deep Tendon Reflexes: Reflexes are normal and symmetric. Reflexes normal.  Psychiatric:        Mood and Affect: Mood normal.        Behavior: Behavior normal.        Thought Content: Thought content normal.        Judgment: Judgment normal.           Assessment & Plan:  Well exam. We discussed diet and exercise.  Get fasting labs. Garnette Olmsted, MD

## 2023-09-21 ENCOUNTER — Encounter: Payer: Self-pay | Admitting: *Deleted

## 2023-11-03 DIAGNOSIS — M47812 Spondylosis without myelopathy or radiculopathy, cervical region: Secondary | ICD-10-CM | POA: Diagnosis not present

## 2023-11-03 DIAGNOSIS — M62830 Muscle spasm of back: Secondary | ICD-10-CM | POA: Diagnosis not present

## 2023-11-03 DIAGNOSIS — G894 Chronic pain syndrome: Secondary | ICD-10-CM | POA: Diagnosis not present

## 2023-11-03 DIAGNOSIS — Z79891 Long term (current) use of opiate analgesic: Secondary | ICD-10-CM | POA: Diagnosis not present

## 2023-12-29 DIAGNOSIS — Z79891 Long term (current) use of opiate analgesic: Secondary | ICD-10-CM | POA: Diagnosis not present

## 2023-12-29 DIAGNOSIS — M47812 Spondylosis without myelopathy or radiculopathy, cervical region: Secondary | ICD-10-CM | POA: Diagnosis not present

## 2023-12-29 DIAGNOSIS — G894 Chronic pain syndrome: Secondary | ICD-10-CM | POA: Diagnosis not present

## 2023-12-29 DIAGNOSIS — M62830 Muscle spasm of back: Secondary | ICD-10-CM | POA: Diagnosis not present

## 2024-03-27 DIAGNOSIS — Z79891 Long term (current) use of opiate analgesic: Secondary | ICD-10-CM | POA: Diagnosis not present

## 2024-03-27 DIAGNOSIS — G894 Chronic pain syndrome: Secondary | ICD-10-CM | POA: Diagnosis not present

## 2024-03-27 DIAGNOSIS — M47812 Spondylosis without myelopathy or radiculopathy, cervical region: Secondary | ICD-10-CM | POA: Diagnosis not present

## 2024-03-27 DIAGNOSIS — M62830 Muscle spasm of back: Secondary | ICD-10-CM | POA: Diagnosis not present

## 2024-05-23 DIAGNOSIS — M47812 Spondylosis without myelopathy or radiculopathy, cervical region: Secondary | ICD-10-CM | POA: Diagnosis not present

## 2024-05-23 DIAGNOSIS — G894 Chronic pain syndrome: Secondary | ICD-10-CM | POA: Diagnosis not present

## 2024-05-23 DIAGNOSIS — M62830 Muscle spasm of back: Secondary | ICD-10-CM | POA: Diagnosis not present

## 2024-05-23 DIAGNOSIS — Z79891 Long term (current) use of opiate analgesic: Secondary | ICD-10-CM | POA: Diagnosis not present

## 2024-07-10 DIAGNOSIS — M47812 Spondylosis without myelopathy or radiculopathy, cervical region: Secondary | ICD-10-CM | POA: Diagnosis not present

## 2024-07-10 DIAGNOSIS — Z79891 Long term (current) use of opiate analgesic: Secondary | ICD-10-CM | POA: Diagnosis not present

## 2024-07-10 DIAGNOSIS — G894 Chronic pain syndrome: Secondary | ICD-10-CM | POA: Diagnosis not present

## 2024-07-10 DIAGNOSIS — M62830 Muscle spasm of back: Secondary | ICD-10-CM | POA: Diagnosis not present
